# Patient Record
Sex: Male | Born: 1965 | Race: Black or African American | Hispanic: No | Marital: Married | Smoking: Never smoker
Health system: Southern US, Community
[De-identification: ages and names within clinical notes are randomized; demographics above are authoritative.]

## PROBLEM LIST (undated history)

## (undated) DIAGNOSIS — K5090 Crohn's disease, unspecified, without complications: Secondary | ICD-10-CM

## (undated) DIAGNOSIS — M1990 Unspecified osteoarthritis, unspecified site: Secondary | ICD-10-CM

## (undated) DIAGNOSIS — K219 Gastro-esophageal reflux disease without esophagitis: Secondary | ICD-10-CM

## (undated) DIAGNOSIS — I10 Essential (primary) hypertension: Secondary | ICD-10-CM

## (undated) HISTORY — PX: OTHER SURGICAL HISTORY: SHX169

## (undated) HISTORY — PX: APPENDECTOMY: SHX54

## (undated) HISTORY — PX: ABDOMINAL SURGERY: SHX537

## (undated) HISTORY — PX: TESTICLE SURGERY: SHX794

---

## 2004-10-05 ENCOUNTER — Ambulatory Visit: Payer: Self-pay | Admitting: Internal Medicine

## 2004-10-05 ENCOUNTER — Emergency Department: Payer: Self-pay | Admitting: Unknown Physician Specialty

## 2005-06-22 ENCOUNTER — Ambulatory Visit: Payer: Self-pay | Admitting: Internal Medicine

## 2005-06-29 ENCOUNTER — Emergency Department: Payer: Self-pay | Admitting: Emergency Medicine

## 2005-06-29 ENCOUNTER — Other Ambulatory Visit: Payer: Self-pay

## 2005-11-15 ENCOUNTER — Emergency Department: Payer: Self-pay | Admitting: Emergency Medicine

## 2007-01-24 ENCOUNTER — Ambulatory Visit: Payer: Self-pay | Admitting: Internal Medicine

## 2007-02-17 ENCOUNTER — Emergency Department: Payer: Self-pay | Admitting: Internal Medicine

## 2007-05-29 ENCOUNTER — Other Ambulatory Visit: Payer: Self-pay

## 2007-05-29 ENCOUNTER — Emergency Department: Payer: Self-pay | Admitting: Emergency Medicine

## 2007-08-02 ENCOUNTER — Emergency Department: Payer: Self-pay | Admitting: Unknown Physician Specialty

## 2007-08-02 ENCOUNTER — Other Ambulatory Visit: Payer: Self-pay

## 2007-09-01 ENCOUNTER — Emergency Department: Payer: Self-pay | Admitting: Emergency Medicine

## 2008-02-18 ENCOUNTER — Ambulatory Visit: Payer: Self-pay | Admitting: Gastroenterology

## 2008-03-07 ENCOUNTER — Emergency Department: Payer: Self-pay | Admitting: Emergency Medicine

## 2008-03-10 ENCOUNTER — Ambulatory Visit: Payer: Self-pay | Admitting: Gastroenterology

## 2008-07-02 ENCOUNTER — Ambulatory Visit: Payer: Self-pay | Admitting: Gastroenterology

## 2009-01-13 ENCOUNTER — Ambulatory Visit: Payer: Self-pay | Admitting: Internal Medicine

## 2009-07-18 ENCOUNTER — Emergency Department: Payer: Self-pay | Admitting: Unknown Physician Specialty

## 2010-03-16 ENCOUNTER — Ambulatory Visit: Payer: Self-pay | Admitting: Internal Medicine

## 2011-02-24 ENCOUNTER — Ambulatory Visit: Payer: Self-pay | Admitting: Gastroenterology

## 2011-03-03 ENCOUNTER — Ambulatory Visit: Payer: Self-pay | Admitting: Gastroenterology

## 2011-03-07 LAB — PATHOLOGY REPORT

## 2011-04-13 ENCOUNTER — Other Ambulatory Visit: Payer: Self-pay | Admitting: Gastroenterology

## 2012-07-29 ENCOUNTER — Emergency Department: Payer: Self-pay | Admitting: Emergency Medicine

## 2012-07-29 LAB — URINALYSIS, COMPLETE
Bacteria: NONE SEEN
Bilirubin,UR: NEGATIVE
Blood: NEGATIVE
Ketone: NEGATIVE
Nitrite: NEGATIVE
Ph: 5 (ref 4.5–8.0)
RBC,UR: 1 /HPF (ref 0–5)
Specific Gravity: 1.028 (ref 1.003–1.030)
WBC UR: 1 /HPF (ref 0–5)

## 2012-07-29 LAB — COMPREHENSIVE METABOLIC PANEL
Albumin: 3.9 g/dL (ref 3.4–5.0)
Alkaline Phosphatase: 129 U/L (ref 50–136)
Anion Gap: 6 — ABNORMAL LOW (ref 7–16)
BUN: 14 mg/dL (ref 7–18)
Calcium, Total: 9.2 mg/dL (ref 8.5–10.1)
Chloride: 105 mmol/L (ref 98–107)
Co2: 30 mmol/L (ref 21–32)
Creatinine: 1.19 mg/dL (ref 0.60–1.30)
Osmolality: 281 (ref 275–301)
Total Protein: 7.9 g/dL (ref 6.4–8.2)

## 2012-07-29 LAB — CBC
MCH: 26.7 pg (ref 26.0–34.0)
MCHC: 32.8 g/dL (ref 32.0–36.0)
MCV: 82 fL (ref 80–100)
Platelet: 289 10*3/uL (ref 150–440)
RBC: 5.01 10*6/uL (ref 4.40–5.90)
RDW: 15.1 % — ABNORMAL HIGH (ref 11.5–14.5)

## 2012-07-29 LAB — LIPASE, BLOOD: Lipase: 109 U/L (ref 73–393)

## 2012-08-11 ENCOUNTER — Emergency Department: Payer: Self-pay | Admitting: Internal Medicine

## 2012-08-11 LAB — URINALYSIS, COMPLETE
Bacteria: NONE SEEN
Blood: NEGATIVE
Glucose,UR: NEGATIVE mg/dL (ref 0–75)
Ketone: NEGATIVE
Leukocyte Esterase: NEGATIVE
Ph: 6 (ref 4.5–8.0)
RBC,UR: 3 /HPF (ref 0–5)
Specific Gravity: 1.02 (ref 1.003–1.030)
WBC UR: 2 /HPF (ref 0–5)

## 2012-08-11 LAB — COMPREHENSIVE METABOLIC PANEL
Alkaline Phosphatase: 108 U/L (ref 50–136)
Anion Gap: 7 (ref 7–16)
BUN: 17 mg/dL (ref 7–18)
Bilirubin,Total: 0.4 mg/dL (ref 0.2–1.0)
Chloride: 104 mmol/L (ref 98–107)
Co2: 31 mmol/L (ref 21–32)
Osmolality: 286 (ref 275–301)
Potassium: 3.1 mmol/L — ABNORMAL LOW (ref 3.5–5.1)
Sodium: 142 mmol/L (ref 136–145)
Total Protein: 7.1 g/dL (ref 6.4–8.2)

## 2012-08-11 LAB — CBC
HCT: 38 % — ABNORMAL LOW (ref 40.0–52.0)
HGB: 12.9 g/dL — ABNORMAL LOW (ref 13.0–18.0)
MCH: 27.4 pg (ref 26.0–34.0)
MCV: 81 fL (ref 80–100)
Platelet: 304 10*3/uL (ref 150–440)
RBC: 4.7 10*6/uL (ref 4.40–5.90)
RDW: 15 % — ABNORMAL HIGH (ref 11.5–14.5)

## 2012-08-11 LAB — LIPASE, BLOOD: Lipase: 101 U/L (ref 73–393)

## 2012-08-15 ENCOUNTER — Ambulatory Visit: Payer: Self-pay | Admitting: Gastroenterology

## 2012-08-16 LAB — PATHOLOGY REPORT

## 2012-12-13 ENCOUNTER — Other Ambulatory Visit: Payer: Self-pay | Admitting: Gastroenterology

## 2012-12-13 LAB — CLOSTRIDIUM DIFFICILE BY PCR

## 2012-12-15 LAB — STOOL CULTURE

## 2013-03-08 ENCOUNTER — Emergency Department: Payer: Self-pay | Admitting: Emergency Medicine

## 2013-03-16 ENCOUNTER — Emergency Department: Payer: Self-pay | Admitting: Emergency Medicine

## 2013-03-16 LAB — CBC
HGB: 12.8 g/dL — ABNORMAL LOW (ref 13.0–18.0)
MCHC: 34.9 g/dL (ref 32.0–36.0)
MCV: 80 fL (ref 80–100)
Platelet: 264 10*3/uL (ref 150–440)
WBC: 9.1 10*3/uL (ref 3.8–10.6)

## 2013-03-16 LAB — COMPREHENSIVE METABOLIC PANEL
BUN: 11 mg/dL (ref 7–18)
Bilirubin,Total: 0.4 mg/dL (ref 0.2–1.0)
Calcium, Total: 8.9 mg/dL (ref 8.5–10.1)
Chloride: 106 mmol/L (ref 98–107)
Creatinine: 1.19 mg/dL (ref 0.60–1.30)
EGFR (African American): >60
EGFR (Non-African Amer.): >60
Osmolality: 279 (ref 275–301)
SGPT (ALT): 21 U/L (ref 12–78)
Sodium: 140 mmol/L (ref 136–145)

## 2013-03-16 LAB — LIPASE, BLOOD: Lipase: 66 U/L — ABNORMAL LOW (ref 73–393)

## 2013-03-16 LAB — URINALYSIS, COMPLETE
Ketone: NEGATIVE
RBC,UR: 1 /HPF (ref 0–5)
WBC UR: 2 /HPF (ref 0–5)

## 2013-08-25 ENCOUNTER — Emergency Department: Payer: Self-pay | Admitting: Emergency Medicine

## 2013-08-25 LAB — COMPREHENSIVE METABOLIC PANEL
ALT: 25 U/L (ref 12–78)
Albumin: 3.5 g/dL (ref 3.4–5.0)
Alkaline Phosphatase: 99 U/L
Anion Gap: 3 — ABNORMAL LOW (ref 7–16)
BUN: 14 mg/dL (ref 7–18)
Bilirubin,Total: 0.7 mg/dL (ref 0.2–1.0)
Calcium, Total: 9.2 mg/dL (ref 8.5–10.1)
Chloride: 106 mmol/L (ref 98–107)
Co2: 32 mmol/L (ref 21–32)
Creatinine: 1.18 mg/dL (ref 0.60–1.30)
EGFR (African American): >60
EGFR (Non-African Amer.): >60
Glucose: 91 mg/dL (ref 65–99)
OSMOLALITY: 281 (ref 275–301)
Potassium: 3.1 mmol/L — ABNORMAL LOW (ref 3.5–5.1)
SGOT(AST): 19 U/L (ref 15–37)
Sodium: 141 mmol/L (ref 136–145)
Total Protein: 7 g/dL (ref 6.4–8.2)

## 2013-08-25 LAB — CBC WITH DIFFERENTIAL/PLATELET
BASOS ABS: 0.1 10*3/uL (ref 0.0–0.1)
Basophil %: 0.8 %
Eosinophil #: 0.2 10*3/uL (ref 0.0–0.7)
Eosinophil %: 2.2 %
HCT: 38.3 % — ABNORMAL LOW (ref 40.0–52.0)
HGB: 12.8 g/dL — AB (ref 13.0–18.0)
LYMPHS ABS: 1.5 10*3/uL (ref 1.0–3.6)
Lymphocyte %: 15.9 %
MCH: 27 pg (ref 26.0–34.0)
MCHC: 33.4 g/dL (ref 32.0–36.0)
MCV: 81 fL (ref 80–100)
MONOS PCT: 8.3 %
Monocyte #: 0.8 x10 3/mm (ref 0.2–1.0)
NEUTROS ABS: 6.8 10*3/uL — AB (ref 1.4–6.5)
NEUTROS PCT: 72.8 %
PLATELETS: 315 10*3/uL (ref 150–440)
RBC: 4.75 10*6/uL (ref 4.40–5.90)
RDW: 14.9 % — ABNORMAL HIGH (ref 11.5–14.5)
WBC: 9.3 10*3/uL (ref 3.8–10.6)

## 2013-08-25 LAB — LIPASE, BLOOD: LIPASE: 98 U/L (ref 73–393)

## 2013-10-15 ENCOUNTER — Emergency Department: Payer: Self-pay | Admitting: Emergency Medicine

## 2013-10-15 LAB — CBC WITH DIFFERENTIAL/PLATELET
Basophil #: 0.1 10*3/uL (ref 0.0–0.1)
Basophil %: 0.9 %
Eosinophil #: 0.3 10*3/uL (ref 0.0–0.7)
Eosinophil %: 3.4 %
HCT: 39.5 % — ABNORMAL LOW (ref 40.0–52.0)
HGB: 13.2 g/dL (ref 13.0–18.0)
Lymphocyte #: 0.8 10*3/uL — ABNORMAL LOW (ref 1.0–3.6)
Lymphocyte %: 11.1 %
MCH: 27.1 pg (ref 26.0–34.0)
MCHC: 33.5 g/dL (ref 32.0–36.0)
MCV: 81 fL (ref 80–100)
Monocyte #: 0.4 x10 3/mm (ref 0.2–1.0)
Monocyte %: 4.9 %
NEUTROS PCT: 79.7 %
Neutrophil #: 6 10*3/uL (ref 1.4–6.5)
Platelet: 248 10*3/uL (ref 150–440)
RBC: 4.88 10*6/uL (ref 4.40–5.90)
RDW: 14.8 % — ABNORMAL HIGH (ref 11.5–14.5)
WBC: 7.6 10*3/uL (ref 3.8–10.6)

## 2013-10-15 LAB — COMPREHENSIVE METABOLIC PANEL
ALBUMIN: 3.6 g/dL (ref 3.4–5.0)
ALK PHOS: 109 U/L
ANION GAP: 5 — AB (ref 7–16)
BUN: 14 mg/dL (ref 7–18)
Bilirubin,Total: 0.6 mg/dL (ref 0.2–1.0)
CREATININE: 1.17 mg/dL (ref 0.60–1.30)
Calcium, Total: 9 mg/dL (ref 8.5–10.1)
Chloride: 105 mmol/L (ref 98–107)
Co2: 32 mmol/L (ref 21–32)
EGFR (African American): >60
EGFR (Non-African Amer.): >60
Glucose: 118 mg/dL — ABNORMAL HIGH (ref 65–99)
OSMOLALITY: 285 (ref 275–301)
Potassium: 3 mmol/L — ABNORMAL LOW (ref 3.5–5.1)
SGOT(AST): 14 U/L — ABNORMAL LOW (ref 15–37)
SGPT (ALT): 21 U/L (ref 12–78)
SODIUM: 142 mmol/L (ref 136–145)
Total Protein: 7.1 g/dL (ref 6.4–8.2)

## 2013-10-15 LAB — URINALYSIS, COMPLETE
BACTERIA: NONE SEEN
BLOOD: NEGATIVE
Bilirubin,UR: NEGATIVE
Glucose,UR: NEGATIVE mg/dL (ref 0–75)
KETONE: NEGATIVE
Leukocyte Esterase: NEGATIVE
Nitrite: NEGATIVE
Ph: 5 (ref 4.5–8.0)
Protein: NEGATIVE
RBC,UR: <1 /HPF (ref 0–5)
Specific Gravity: 1.02 (ref 1.003–1.030)

## 2013-10-15 LAB — LIPASE, BLOOD: Lipase: 99 U/L (ref 73–393)

## 2014-01-10 ENCOUNTER — Emergency Department: Payer: Self-pay | Admitting: Emergency Medicine

## 2014-07-04 ENCOUNTER — Emergency Department: Payer: Self-pay | Admitting: Emergency Medicine

## 2014-07-07 ENCOUNTER — Emergency Department: Payer: Self-pay | Admitting: Student

## 2014-07-17 ENCOUNTER — Emergency Department: Payer: Self-pay | Admitting: Emergency Medicine

## 2014-11-13 ENCOUNTER — Emergency Department
Admission: EM | Admit: 2014-11-13 | Discharge: 2014-11-13 | Disposition: A | Discharge status: Home / Self Care | Payer: 59 | Attending: Student | Admitting: Student

## 2014-11-13 DIAGNOSIS — M549 Dorsalgia, unspecified: Secondary | ICD-10-CM | POA: Diagnosis present

## 2014-11-13 DIAGNOSIS — M5416 Radiculopathy, lumbar region: Secondary | ICD-10-CM | POA: Insufficient documentation

## 2014-11-13 DIAGNOSIS — M62830 Muscle spasm of back: Secondary | ICD-10-CM

## 2014-11-13 MED ORDER — TRAMADOL HCL 50 MG PO TABS: 50.0000 mg | ORAL_TABLET | Freq: Four times a day (QID) | ORAL | Status: DC | PRN

## 2014-11-13 MED ORDER — PREDNISONE 10 MG PO TABS: 10.0000 mg | ORAL_TABLET | Freq: Every day | ORAL | Status: DC

## 2014-11-13 MED ORDER — CYCLOBENZAPRINE HCL 5 MG PO TABS: 5.0000 mg | ORAL_TABLET | Freq: Three times a day (TID) | ORAL | Status: DC | PRN

## 2014-11-13 NOTE — ED Notes (Signed)
Pt states "i've been dealing with this for a long time, more than a couple of weeks." pt complains of low and mid back pain that radiates down both posterior legs, more right than left per pt. Pt denies tingling to arms, legs.

## 2014-11-13 NOTE — ED Provider Notes (Signed)
CSN: [id number]     Arrival date & time 11/13/14  1942 History   First MD Initiated Contact with Patient 11/13/14 2134     Chief Complaint  Patient presents with  . Back Pain     (Consider location/radiation/quality/duration/timing/severity/associated sxs/prior Treatment) Patient is a 49 y.o. male presenting with back pain.  Back Pain Associated symptoms: no abdominal pain, no chest pain, no dysuria, no fever and no headaches    49 year old male with 3-4 week history of low back pain described as tightness and spasms with shooting pain going down the right posterior thigh and calf. Patient typically takes Flexeril but has been out of his muscle relaxers for the last few weeks. His pain has been moderate, 8 out of 10. He denies any weakness or loss of bowel or bladder symptoms. No trauma or injury. Pain is increased with standing walking and twisting. He gets relief with lying. He denies any urinary symptoms.   No past medical history on file. No past surgical history on file. No family history on file. History  Substance Use Topics  . Smoking status: Not on file  . Smokeless tobacco: Not on file  . Alcohol Use: Not on file    Review of Systems  Constitutional: Negative.  Negative for fever, chills, activity change and appetite change.  HENT: Negative for congestion, ear pain, mouth sores, rhinorrhea, sinus pressure, sore throat and trouble swallowing.   Eyes: Negative for photophobia, pain and discharge.  Respiratory: Negative for cough, chest tightness and shortness of breath.   Cardiovascular: Negative for chest pain and leg swelling.  Gastrointestinal: Negative for nausea, vomiting, abdominal pain, diarrhea and abdominal distention.  Genitourinary: Negative for dysuria and difficulty urinating.  Musculoskeletal: Positive for back pain. Negative for arthralgias and gait problem.  Skin: Negative for color change and rash.  Neurological: Negative for dizziness and headaches.   Hematological: Negative for adenopathy.  Psychiatric/Behavioral: Negative for behavioral problems and agitation.      Allergies  Ivp dye  Home Medications   Prior to Admission medications   Medication Sig Start Date End Date Taking? Authorizing Provider  cyclobenzaprine (FLEXERIL) 5 MG tablet Take 1 tablet (5 mg total) by mouth every 8 (eight) hours as needed for muscle spasms. 11/13/14   Duanne Guess, PA-C  predniSONE (DELTASONE) 10 MG tablet Take 1 tablet (10 mg total) by mouth daily. 6,5,4,3,2,1 six day taper 11/13/14   Duanne Guess, PA-C  traMADol (ULTRAM) 50 MG tablet Take 1 tablet (50 mg total) by mouth every 6 (six) hours as needed. 11/13/14   Duanne Guess, PA-C   BP 127/81 mmHg  Pulse 84  Temp(Src) 98.4 F (36.9 C) (Oral)  Resp 14  Ht 5' 5"  (1.651 m)  Wt 222 lb (100.699 kg)  BMI 36.94 kg/m2  SpO2 98% Physical Exam  Constitutional: He is oriented to person, place, and time. He appears well-developed and well-nourished.  HENT:  Head: Normocephalic and atraumatic.  Eyes: Conjunctivae and EOM are normal. Pupils are equal, round, and reactive to light.  Neck: Normal range of motion. Neck supple.  Cardiovascular: Normal rate, regular rhythm, normal heart sounds and intact distal pulses.   Pulmonary/Chest: Effort normal and breath sounds normal. No respiratory distress. He has no wheezes. He has no rales. He exhibits no tenderness.  Abdominal: Soft. Bowel sounds are normal. He exhibits no distension. There is no tenderness.  Musculoskeletal:       Lumbar back: He exhibits spasm (paravertebral muscles lumbar spine and  lumbosacral junction). He exhibits normal range of motion, no tenderness, no bony tenderness, no swelling and no edema.  Patient with negative straight leg raise bilaterally. Full range of motion of the hips knees and ankles. Neurovascular intact in bilateral lower extremities  Neurological: He is alert and oriented to person, place, and time.  Skin: Skin is  warm and dry.  Psychiatric: He has a normal mood and affect. His behavior is normal. Judgment and thought content normal.    ED Course  Procedures (including critical care time) Labs Review Labs Reviewed - No data to display  Imaging Review No results found.   EKG Interpretation None      MDM   Final diagnoses:  Lumbar back pain with radiculopathy affecting right lower extremity  Lumbar paraspinal muscle spasm    49 year old male with 3-4 week history of tightness in the lower back with worsening pain shooting down the posterior aspect of his right thigh and calf. Patient was given a prescription for 6 day prednisone taper, Flexeril, tramadol. He will follow-up with orthopedics if no relief in one week. Return to the ER for worsening symptoms or urgent changes in his health    Duanne Guess, PA-C 11/13/14 2149  Joanne Gavel, MD 11/14/14 [phone]

## 2014-11-13 NOTE — Discharge Instructions (Signed)
Back Exercises Back exercises help treat and prevent back injuries. The goal of back exercises is to increase the strength of your abdominal and back muscles and the flexibility of your back. These exercises should be started when you no longer have back pain. Back exercises include:  Pelvic Tilt. Lie on your back with your knees bent. Tilt your pelvis until the lower part of your back is against the floor. Hold this position 5 to 10 sec and repeat 5 to 10 times.  Knee to Chest. Pull first 1 knee up against your chest and hold for 20 to 30 seconds, repeat this with the other knee, and then both knees. This may be done with the other leg straight or bent, whichever feels better.  Sit-Ups or Curl-Ups. Bend your knees 90 degrees. Start with tilting your pelvis, and do a partial, slow sit-up, lifting your trunk only 30 to 45 degrees off the floor. Take at least 2 to 3 seconds for each sit-up. Do not do sit-ups with your knees out straight. If partial sit-ups are difficult, simply do the above but with only tightening your abdominal muscles and holding it as directed.  Hip-Lift. Lie on your back with your knees flexed 90 degrees. Push down with your feet and shoulders as you raise your hips a couple inches off the floor; hold for 10 seconds, repeat 5 to 10 times.  Back arches. Lie on your stomach, propping yourself up on bent elbows. Slowly press on your hands, causing an arch in your low back. Repeat 3 to 5 times. Any initial stiffness and discomfort should lessen with repetition over time.  Shoulder-Lifts. Lie face down with arms beside your body. Keep hips and torso pressed to floor as you slowly lift your head and shoulders off the floor. Do not overdo your exercises, especially in the beginning. Exercises may cause you some mild back discomfort which lasts for a few minutes; however, if the pain is more severe, or lasts for more than 15 minutes, do not continue exercises until you see your caregiver.  Improvement with exercise therapy for back problems is slow.  See your caregivers for assistance with developing a proper back exercise program. Document Released: 06/08/2004 Document Revised: 07/24/2011 Document Reviewed: 03/02/2011 [facility] Patient Information 2015 [place], [place]. This information is not intended to replace advice given to you by your health care provider. Make sure you discuss any questions you have with your health care provider.  Back Pain, Adult Back pain is very common. The pain often gets better over time. The cause of back pain is usually not dangerous. Most people can learn to manage their back pain on their own.  HOME CARE   Stay active. Start with short walks on flat ground if you can. Try to walk farther each day.  Do not sit, drive, or stand in one place for more than 30 minutes. Do not stay in bed.  Do not avoid exercise or work. Activity can help your back heal faster.  Be careful when you bend or lift an object. Bend at your knees, keep the object close to you, and do not twist.  Sleep on a firm mattress. Lie on your side, and bend your knees. If you lie on your back, put a pillow under your knees.  Only take medicines as told by your doctor.  Put ice on the injured area.  Put ice in a plastic bag.  Place a towel between your skin and the bag.  Leave the  ice on for 15-20 minutes, 03-04 times a day for the first 2 to 3 days. After that, you can switch between ice and heat packs.  Ask your doctor about back exercises or massage.  Avoid feeling anxious or stressed. Find good ways to deal with stress, such as exercise. GET HELP RIGHT AWAY IF:   Your pain does not go away with rest or medicine.  Your pain does not go away in 1 week.  You have new problems.  You do not feel well.  The pain spreads into your legs.  You cannot control when you poop (bowel movement) or pee (urinate).  Your arms or legs feel weak or lose feeling  (numbness).  You feel sick to your stomach (nauseous) or throw up (vomit).  You have belly (abdominal) pain.  You feel like you may pass out (faint). MAKE SURE YOU:   Understand these instructions.  Will watch your condition.  Will get help right away if you are not doing well or get worse. Document Released: 10/18/2007 Document Revised: 07/24/2011 Document Reviewed: 09/02/2013 [facility] Patient Information 2015 [place], [place]. This information is not intended to replace advice given to you by your health care provider. Make sure you discuss any questions you have with your health care provider.

## 2014-11-13 NOTE — ED Notes (Signed)
Pt reports pain to lower to mid back x over two weeks.  Pt reports radiation down both legs.  Pt reports it has happened before and pt had sciatica.  Pt reports accident many years ago that has given him chronic problems with his back.  Pt denies numbness/tingling.  Pt NAD at this time.

## 2014-12-10 ENCOUNTER — Emergency Department: Payer: 59

## 2014-12-10 ENCOUNTER — Encounter: Payer: Self-pay | Admitting: Emergency Medicine

## 2014-12-10 ENCOUNTER — Emergency Department
Admission: EM | Admit: 2014-12-10 | Discharge: 2014-12-10 | Disposition: A | Discharge status: Home / Self Care | Payer: 59 | Attending: Emergency Medicine | Admitting: Emergency Medicine

## 2014-12-10 DIAGNOSIS — M549 Dorsalgia, unspecified: Secondary | ICD-10-CM | POA: Insufficient documentation

## 2014-12-10 DIAGNOSIS — Z79899 Other long term (current) drug therapy: Secondary | ICD-10-CM | POA: Insufficient documentation

## 2014-12-10 DIAGNOSIS — I10 Essential (primary) hypertension: Secondary | ICD-10-CM | POA: Diagnosis not present

## 2014-12-10 DIAGNOSIS — G8929 Other chronic pain: Secondary | ICD-10-CM | POA: Insufficient documentation

## 2014-12-10 DIAGNOSIS — E876 Hypokalemia: Secondary | ICD-10-CM | POA: Insufficient documentation

## 2014-12-10 HISTORY — DX: Essential (primary) hypertension: I10

## 2014-12-10 LAB — URINALYSIS COMPLETE WITH MICROSCOPIC (ARMC ONLY)
BILIRUBIN URINE: NEGATIVE
Bacteria, UA: NONE SEEN
Glucose, UA: NEGATIVE mg/dL
HGB URINE DIPSTICK: NEGATIVE
KETONES UR: NEGATIVE mg/dL
LEUKOCYTES UA: NEGATIVE
Nitrite: NEGATIVE
Protein, ur: NEGATIVE mg/dL
Specific Gravity, Urine: 1.021 (ref 1.005–1.030)
pH: 5 (ref 5.0–8.0)

## 2014-12-10 LAB — CBC WITH DIFFERENTIAL/PLATELET
Basophils Absolute: 0.1 10*3/uL (ref 0–0.1)
Basophils Relative: 1 %
EOS PCT: 5 %
Eosinophils Absolute: 0.3 10*3/uL (ref 0–0.7)
HCT: 39.9 % — ABNORMAL LOW (ref 40.0–52.0)
Hemoglobin: 13.1 g/dL (ref 13.0–18.0)
LYMPHS ABS: 1.3 10*3/uL (ref 1.0–3.6)
Lymphocytes Relative: 22 %
MCH: 26.5 pg (ref 26.0–34.0)
MCHC: 32.8 g/dL (ref 32.0–36.0)
MCV: 80.9 fL (ref 80.0–100.0)
MONO ABS: 0.6 10*3/uL (ref 0.2–1.0)
MONOS PCT: 10 %
Neutro Abs: 3.9 10*3/uL (ref 1.4–6.5)
Neutrophils Relative %: 62 %
Platelets: 295 10*3/uL (ref 150–440)
RBC: 4.94 MIL/uL (ref 4.40–5.90)
RDW: 15 % — ABNORMAL HIGH (ref 11.5–14.5)
WBC: 6.2 10*3/uL (ref 3.8–10.6)

## 2014-12-10 LAB — COMPREHENSIVE METABOLIC PANEL
ALBUMIN: 4.1 g/dL (ref 3.5–5.0)
ALT: 20 U/L (ref 17–63)
ANION GAP: 8 (ref 5–15)
AST: 19 U/L (ref 15–41)
Alkaline Phosphatase: 91 U/L (ref 38–126)
BILIRUBIN TOTAL: 1 mg/dL (ref 0.3–1.2)
BUN: 18 mg/dL (ref 6–20)
CALCIUM: 9.4 mg/dL (ref 8.9–10.3)
CO2: 27 mmol/L (ref 22–32)
CREATININE: 1.17 mg/dL (ref 0.61–1.24)
Chloride: 104 mmol/L (ref 101–111)
GFR calc non Af Amer: >60 mL/min (ref >60)
Glucose, Bld: 107 mg/dL — ABNORMAL HIGH (ref 65–99)
Potassium: 3.3 mmol/L — ABNORMAL LOW (ref 3.5–5.1)
Sodium: 139 mmol/L (ref 135–145)
Total Protein: 7.8 g/dL (ref 6.5–8.1)

## 2014-12-10 MED ORDER — ETODOLAC 400 MG PO TABS: 400.0000 mg | ORAL_TABLET | Freq: Two times a day (BID) | ORAL | Status: DC

## 2014-12-10 MED ORDER — POTASSIUM CHLORIDE CRYS ER 10 MEQ PO TBCR: EXTENDED_RELEASE_TABLET | ORAL | Status: DC

## 2014-12-10 MED ORDER — POTASSIUM CHLORIDE CRYS ER 20 MEQ PO TBCR
10.0000 meq | EXTENDED_RELEASE_TABLET | Freq: Once | ORAL | Status: AC
  Administered 2014-12-10: 10 meq via ORAL
  Filled 2014-12-10: qty 1

## 2014-12-10 MED ORDER — KETOROLAC TROMETHAMINE 60 MG/2ML IM SOLN
60.0000 mg | Freq: Once | INTRAMUSCULAR | Status: AC
  Administered 2014-12-10: 60 mg via INTRAMUSCULAR
  Filled 2014-12-10: qty 2

## 2014-12-10 NOTE — ED Notes (Signed)
Patient transported to CT 

## 2014-12-10 NOTE — Discharge Instructions (Signed)
CALL TO MAKE AN APPOINTMENT WITH DR. Roland Rack AT [facility] FOR INFLAMMATION AND PAIN ICE OR HEAT TO BACK AS NEEDED

## 2014-12-10 NOTE — ED Notes (Signed)
Pt states he has chronic back pain, but muscle spasms and pain have increased recently.

## 2014-12-10 NOTE — ED Provider Notes (Signed)
[facility] Emergency Department Provider Note  ____________________________________________  Time seen:  8:16 AM  I have reviewed the triage vital signs and the nursing notes.   HISTORY  Chief Complaint Back Pain   HPI Spencer Heath is a 49 y.o. male is here today with complaint of chronic back pain. He states that he is having spasms and increased pain. He has been seen the first of this month in the emergency room for the same. He states that he was not given anyone to follow-up with. He denies any injury and states that he was taking Flexeril as muscle relaxant and has now ran out.He does get some relief when lying down however pain is increased with standing and walking. He denies any bowel or bladder incontinence. He does not have a regular doctor that he sees for his back. Currently he states his pain is 10 out of 10.  Prior to discharge she also told the nurse that he had "tingling in his face" for 48 hours. He denies any difficulty swallowing or speaking or breathing. He denied any visual changes. He denies any upper respiratory symptoms or throat pain. There is been no fever or chills. He denies any headaches. He denies any previous stroke symptoms.   Past Medical History  Diagnosis Date  . Hypertension     There are no active problems to display for this patient.   Past Surgical History  Procedure Laterality Date  . Abdominal surgery    . Testicle surgery      Current Outpatient Rx  Name  Route  Sig  Dispense  Refill  . cyclobenzaprine (FLEXERIL) 5 MG tablet   Oral   Take 1 tablet (5 mg total) by mouth every 8 (eight) hours as needed for muscle spasms.   30 tablet   1   . etodolac (LODINE) 400 MG tablet   Oral   Take 1 tablet (400 mg total) by mouth 2 (two) times daily.   20 tablet   0   . potassium chloride (K-DUR,KLOR-CON) 10 MEQ tablet      Take one tablet per day x 5 days starting Friday   5 tablet   0   . predniSONE  (DELTASONE) 10 MG tablet   Oral   Take 1 tablet (10 mg total) by mouth daily. 6,5,4,3,2,1 six day taper   21 tablet   0   . traMADol (ULTRAM) 50 MG tablet   Oral   Take 1 tablet (50 mg total) by mouth every 6 (six) hours as needed.   20 tablet   0     Allergies Ivp dye  No family history on file.  Social History History  Substance Use Topics  . Smoking status: Never Smoker   . Smokeless tobacco: Not on file  . Alcohol Use: No    Review of Systems Constitutional: No fever/chills Eyes: No visual changes. ENT: No sore throat. Cardiovascular: Denies chest pain. Respiratory: Denies shortness of breath. Gastrointestinal: No abdominal pain.  No nausea, no vomiting. . Genitourinary: Negative for dysuria. Musculoskeletal: Positive for back pain. Skin: Negative for rash. Neurological: Negative for headaches, focal weakness. Positive for bilateral facial numbness.  10-point ROS otherwise negative.  ____________________________________________   PHYSICAL EXAM:  VITAL SIGNS: ED Triage Vitals  Enc Vitals Group     BP 12/10/14 0808 118/74 mmHg     Pulse Rate 12/10/14 0808 83     Resp --      Temp 12/10/14 0808 98.1 F (  36.7 C)     Temp Source 12/10/14 0808 Oral     SpO2 12/10/14 0808 98 %     Weight 12/10/14 0808 222 lb (100.699 kg)     Height 12/10/14 0808 5' 5"  (1.651 m)     Head Cir --      Peak Flow --      Pain Score --      Pain Loc --      Pain Edu? --      Excl. in [place]? --     Constitutional: Alert and oriented. Well appearing and in no acute distress. Eyes: Conjunctivae are normal. PERRL. EOMI. Head: Atraumatic. Nose: No congestion/rhinnorhea. Mouth/Throat: Mucous membranes are moist.  Oropharynx non-erythematous. Neck: No stridor.  No cervical tenderness on palpation Hematological/Lymphatic/Immunilogical: No cervical lymphadenopathy. Cardiovascular: Normal rate, regular rhythm. Grossly normal heart sounds.  Good peripheral circulation. Respiratory:  Normal respiratory effort.  No retractions. Lungs CTAB. Gastrointestinal: Soft and nontender. No distention. No abdominal bruits. No CVA tenderness. Musculoskeletal: Back exam no gross deformity. There is tenderness to light palpation diffusely over the entire back. No active muscle spasms were seen. Patient had guarded gait and range of motion. No lower extremity tenderness nor edema.  No joint effusions. Neurologic:  Normal speech and language. No gross focal neurologic deficits are appreciated. No gait instability. Cranial nerves II through XII grossly intact. Reflexes 1+ bilaterally. Grip strength equal bilaterally Skin:  Skin is warm, dry and intact. No rash noted. Psychiatric: Mood and affect are normal. Speech and behavior are normal.  ____________________________________________   LABS (all labs ordered are listed, but only abnormal results are displayed)  Labs Reviewed  CBC WITH DIFFERENTIAL/PLATELET - Abnormal; Notable for the following:    HCT 39.9 (*)    RDW 15.0 (*)    All other components within normal limits  COMPREHENSIVE METABOLIC PANEL - Abnormal; Notable for the following:    Potassium 3.3 (*)    Glucose, Bld 107 (*)    All other components within normal limits  URINALYSIS COMPLETEWITH MICROSCOPIC ([facility] ONLY) - Abnormal; Notable for the following:    Color, Urine YELLOW (*)    APPearance CLEAR (*)    Squamous Epithelial / LPF 0-5 (*)    All other components within normal limits   RADIOLOGY  CT of the head per radiologist shows slight small vessel disease but no intercranial mass or hemorrhage noted no acute infarct ____________________________________________   PROCEDURES  Procedure(s) performed: None  Critical Care performed: No  ____________________________________________   INITIAL IMPRESSION / ASSESSMENT AND PLAN / ED COURSE  Pertinent labs & imaging results that were available during my care of the patient were reviewed by me and considered in my  medical decision making (see chart for details).  Patient was given potassium while in the emergency room for low potassium of 3.3. He is also reassured that his CT scan did not show signs of a stroke and neurologically he was intact. Patient is to follow-up with Dr. Roland Rack about his chronic back pain. Patient was given a prescription for 5 days of K-Dur were 10 mEq. His also given a prescription for etodolac for his chronic back pain. He is return to the emergency room if any severe worsening or urgent concerns. He will also make an appointment with his PCP to have his potassium rechecked. ____________________________________________   FINAL CLINICAL IMPRESSION(S) / ED DIAGNOSES  Final diagnoses:  Chronic back pain greater than 3 months duration  Acute hypokalemia  Johnn Hai, PA-C 12/10/14 [place], MD 12/10/14 [phone]

## 2014-12-10 NOTE — ED Notes (Signed)
Pt states he began feeling facial tingling last night, and this am, his entire face feels "numb." Neuro check remarkable. Rhonda, Spencer Heath notified, orders obtained, will continue to monitor.

## 2014-12-26 ENCOUNTER — Encounter: Payer: Self-pay | Admitting: Emergency Medicine

## 2014-12-26 ENCOUNTER — Emergency Department
Admission: EM | Admit: 2014-12-26 | Discharge: 2014-12-26 | Disposition: A | Discharge status: Home / Self Care | Payer: 59 | Attending: Emergency Medicine | Admitting: Emergency Medicine

## 2014-12-26 DIAGNOSIS — M546 Pain in thoracic spine: Secondary | ICD-10-CM | POA: Insufficient documentation

## 2014-12-26 DIAGNOSIS — Z79899 Other long term (current) drug therapy: Secondary | ICD-10-CM | POA: Diagnosis not present

## 2014-12-26 DIAGNOSIS — I10 Essential (primary) hypertension: Secondary | ICD-10-CM | POA: Diagnosis not present

## 2014-12-26 DIAGNOSIS — M545 Low back pain, unspecified: Secondary | ICD-10-CM

## 2014-12-26 DIAGNOSIS — Z791 Long term (current) use of non-steroidal anti-inflammatories (NSAID): Secondary | ICD-10-CM | POA: Diagnosis not present

## 2014-12-26 HISTORY — DX: Crohn's disease, unspecified, without complications: K50.90

## 2014-12-26 MED ORDER — METHOCARBAMOL 500 MG PO TABS: 500.0000 mg | ORAL_TABLET | Freq: Four times a day (QID) | ORAL | Status: DC | PRN

## 2014-12-26 MED ORDER — KETOROLAC TROMETHAMINE 60 MG/2ML IM SOLN
60.0000 mg | Freq: Once | INTRAMUSCULAR | Status: AC
  Administered 2014-12-26: 60 mg via INTRAMUSCULAR
  Filled 2014-12-26: qty 2

## 2014-12-26 MED ORDER — CAPSAICIN 0.1 % EX CREA: TOPICAL_CREAM | CUTANEOUS | Status: DC

## 2014-12-26 NOTE — ED Provider Notes (Signed)
[facility] Emergency Department Provider Note  ____________________________________________  Time seen: Approximately 2:57 PM  I have reviewed the triage vital signs and the nursing notes.   HISTORY  Chief Complaint No chief complaint on file.    HPI Spencer Heath is a 49 y.o. male presents for evaluation of mid-upper back pain. Patient states he is currently going through physical therapy S2 hurts for the same. Is being followed by orthopedics and medicine. States that the therapy is making his back hurt worse and would like cream to help with that.   Past Medical History  Diagnosis Date  . Hypertension   . Crohn's disease     There are no active problems to display for this patient.   Past Surgical History  Procedure Laterality Date  . Abdominal surgery    . Testicle surgery      Current Outpatient Rx  Name  Route  Sig  Dispense  Refill  . Capsaicin 0.1 % CREA      Apply to needed area twice daily.   1 Tube   3   . etodolac (LODINE) 400 MG tablet   Oral   Take 1 tablet (400 mg total) by mouth 2 (two) times daily.   20 tablet   0   . methocarbamol (ROBAXIN) 500 MG tablet   Oral   Take 1 tablet (500 mg total) by mouth every 6 (six) hours as needed for muscle spasms.   30 tablet   0   . potassium chloride (K-DUR,KLOR-CON) 10 MEQ tablet      Take one tablet per day x 5 days starting Friday   5 tablet   0   . predniSONE (DELTASONE) 10 MG tablet   Oral   Take 1 tablet (10 mg total) by mouth daily. 6,5,4,3,2,1 six day taper   21 tablet   0   . traMADol (ULTRAM) 50 MG tablet   Oral   Take 1 tablet (50 mg total) by mouth every 6 (six) hours as needed.   20 tablet   0     Allergies Ivp dye  No family history on file.  Social History Social History  Substance Use Topics  . Smoking status: Never Smoker   . Smokeless tobacco: Never Used  . Alcohol Use: No    Review of Systems Constitutional: No  fever/chills Eyes: No visual changes. ENT: No sore throat. Cardiovascular: Denies chest pain. Respiratory: Denies shortness of breath. Gastrointestinal: No abdominal pain.  No nausea, no vomiting.  No diarrhea.  No constipation. Genitourinary: Negative for dysuria. Musculoskeletal: Positive for upper mid back pain. Skin: Negative for rash. Neurological: Negative for headaches, focal weakness or numbness.  10-point ROS otherwise negative.  ____________________________________________   PHYSICAL EXAM:  VITAL SIGNS: ED Triage Vitals  Enc Vitals Group     BP 12/26/14 1434 138/81 mmHg     Pulse Rate 12/26/14 1434 74     Resp 12/26/14 1434 18     Temp 12/26/14 1434 98.9 F (37.2 C)     Temp Source 12/26/14 1434 Oral     SpO2 12/26/14 1434 96 %     Weight 12/26/14 1434 219 lb (99.338 kg)     Height 12/26/14 1434 5' 5"  (1.651 m)     Head Cir --      Peak Flow --      Pain Score 12/26/14 1436 8     Pain Loc --      Pain Edu? --  Excl. in [place]? --     Constitutional: Alert and oriented. Well appearing and in no acute distress. He ambulates without difficulty. Neck: No stridor.  Negative cervical tenderness.inal bruits. No CVA tenderness. Musculoskeletal: No lower extremity tenderness nor edema.  No joint effusions. Neurologic:  Normal speech and language. No gross focal neurologic deficits are appreciated. No gait instability. Skin:  Skin is warm, dry and intact. No rash noted. Psychiatric: Mood and affect are normal. Speech and behavior are normal.  ____________________________________________   LABS (all labs ordered are listed, but only abnormal results are displayed)  Labs Reviewed - No data to display   PROCEDURES  Procedure(s) performed: None  Critical Care performed: No  ____________________________________________   INITIAL IMPRESSION / ASSESSMENT AND PLAN / ED COURSE  Pertinent labs & imaging results that were available during my care of the patient  were reviewed by me and considered in my medical decision making (see chart for details).  Recurrent/chronic back pain and currently undergoing physical therapy. Rx given for [place] cream and Robaxin 500 mg. Patient encouraged to follow up with his PCP or return to the ER if needed.  She voices no other emergency medical complaints at this time. ____________________________________________   FINAL CLINICAL IMPRESSION(S) / ED DIAGNOSES  Final diagnoses:  Midline low back pain without sciatica      Arlyss Repress, PA-C 12/26/14 1524  Lavonia Drafts, MD 12/26/14 1525

## 2014-12-26 NOTE — ED Notes (Signed)
Pt reports middle and upper back. Currently receiving rehab for back. Injured back earlier this year and increased pain.

## 2014-12-26 NOTE — Discharge Instructions (Signed)
Back Pain, Adult Low back pain is very common. About 1 in 5 people have back pain.The cause of low back pain is rarely dangerous. The pain often gets better over time.About half of people with a sudden onset of back pain feel better in just 2 weeks. About 8 in 10 people feel better by 6 weeks.  CAUSES Some common causes of back pain include:  Strain of the muscles or ligaments supporting the spine.  Wear and tear (degeneration) of the spinal discs.  Arthritis.  Direct injury to the back. DIAGNOSIS Most of the time, the direct cause of low back pain is not known.However, back pain can be treated effectively even when the exact cause of the pain is unknown.Answering your caregiver's questions about your overall health and symptoms is one of the most accurate ways to make sure the cause of your pain is not dangerous. If your caregiver needs more information, he or she may order lab work or imaging tests (X-rays or MRIs).However, even if imaging tests show changes in your back, this usually does not require surgery. HOME CARE INSTRUCTIONS For many people, back pain returns.Since low back pain is rarely dangerous, it is often a condition that people can learn to manageon their own.   Remain active. It is stressful on the back to sit or stand in one place. Do not sit, drive, or stand in one place for more than 30 minutes at a time. Take short walks on level surfaces as soon as pain allows.Try to increase the length of time you walk each day.  Do not stay in bed.Resting more than 1 or 2 days can delay your recovery.  Do not avoid exercise or work.Your body is made to move.It is not dangerous to be active, even though your back may hurt.Your back will likely heal faster if you return to being active before your pain is gone.  Pay attention to your body when you bend and lift. Many people have less discomfortwhen lifting if they bend their knees, keep the load close to their bodies,and  avoid twisting. Often, the most comfortable positions are those that put less stress on your recovering back.  Find a comfortable position to sleep. Use a firm mattress and lie on your side with your knees slightly bent. If you lie on your back, put a pillow under your knees.  Only take over-the-counter or prescription medicines as directed by your caregiver. Over-the-counter medicines to reduce pain and inflammation are often the most helpful.Your caregiver may prescribe muscle relaxant drugs.These medicines help dull your pain so you can more quickly return to your normal activities and healthy exercise.  Put ice on the injured area.  Put ice in a plastic bag.  Place a towel between your skin and the bag.  Leave the ice on for 15-20 minutes, 03-04 times a day for the first 2 to 3 days. After that, ice and heat may be alternated to reduce pain and spasms.  Ask your caregiver about trying back exercises and gentle massage. This may be of some benefit.  Avoid feeling anxious or stressed.Stress increases muscle tension and can worsen back pain.It is important to recognize when you are anxious or stressed and learn ways to manage it.Exercise is a great option. SEEK MEDICAL CARE IF:  You have pain that is not relieved with rest or medicine.  You have pain that does not improve in 1 week.  You have new symptoms.  You are generally not feeling well. SEEK   IMMEDIATE MEDICAL CARE IF:   You have pain that radiates from your back into your legs.  You develop new bowel or bladder control problems.  You have unusual weakness or numbness in your arms or legs.  You develop nausea or vomiting.  You develop abdominal pain.  You feel faint. Document Released: 05/01/2005 Document Revised: 10/31/2011 Document Reviewed: 09/02/2013 Exit[employer] Patient Information 2015 [employer]. This information is not intended to replace advice given to you by your health care provider. Make sure you  discuss any questions you have with your health care provider.  

## 2015-01-24 ENCOUNTER — Emergency Department: Payer: 59

## 2015-01-24 ENCOUNTER — Inpatient Hospital Stay: Payer: 59

## 2015-01-24 ENCOUNTER — Encounter: Payer: Self-pay | Admitting: *Deleted

## 2015-01-24 ENCOUNTER — Inpatient Hospital Stay
Admission: EM | Admit: 2015-01-24 | Discharge: 2015-01-27 | DRG: 386 | Disposition: A | Discharge status: Home / Self Care | Payer: 59 | Attending: General Surgery | Admitting: General Surgery

## 2015-01-24 DIAGNOSIS — K56609 Unspecified intestinal obstruction, unspecified as to partial versus complete obstruction: Secondary | ICD-10-CM | POA: Diagnosis present

## 2015-01-24 DIAGNOSIS — E876 Hypokalemia: Secondary | ICD-10-CM | POA: Diagnosis present

## 2015-01-24 DIAGNOSIS — Z9049 Acquired absence of other specified parts of digestive tract: Secondary | ICD-10-CM | POA: Diagnosis present

## 2015-01-24 DIAGNOSIS — I10 Essential (primary) hypertension: Secondary | ICD-10-CM | POA: Diagnosis present

## 2015-01-24 DIAGNOSIS — Z91041 Radiographic dye allergy status: Secondary | ICD-10-CM | POA: Diagnosis not present

## 2015-01-24 DIAGNOSIS — K5090 Crohn's disease, unspecified, without complications: Secondary | ICD-10-CM

## 2015-01-24 DIAGNOSIS — Z9889 Other specified postprocedural states: Secondary | ICD-10-CM

## 2015-01-24 DIAGNOSIS — K50012 Crohn's disease of small intestine with intestinal obstruction: Secondary | ICD-10-CM | POA: Diagnosis present

## 2015-01-24 DIAGNOSIS — R51 Headache: Secondary | ICD-10-CM | POA: Diagnosis present

## 2015-01-24 DIAGNOSIS — K5669 Other intestinal obstruction: Secondary | ICD-10-CM | POA: Diagnosis present

## 2015-01-24 DIAGNOSIS — K50912 Crohn's disease, unspecified, with intestinal obstruction: Secondary | ICD-10-CM | POA: Diagnosis not present

## 2015-01-24 DIAGNOSIS — R109 Unspecified abdominal pain: Secondary | ICD-10-CM

## 2015-01-24 LAB — CBC
HCT: 37.7 % — ABNORMAL LOW (ref 40.0–52.0)
HEMOGLOBIN: 12.6 g/dL — AB (ref 13.0–18.0)
MCH: 26.8 pg (ref 26.0–34.0)
MCHC: 33.5 g/dL (ref 32.0–36.0)
MCV: 80.2 fL (ref 80.0–100.0)
Platelets: 275 10*3/uL (ref 150–440)
RBC: 4.7 MIL/uL (ref 4.40–5.90)
RDW: 14.9 % — ABNORMAL HIGH (ref 11.5–14.5)
WBC: 9.9 10*3/uL (ref 3.8–10.6)

## 2015-01-24 LAB — COMPREHENSIVE METABOLIC PANEL
ALBUMIN: 4.2 g/dL (ref 3.5–5.0)
ALK PHOS: 92 U/L (ref 38–126)
ALT: 18 U/L (ref 17–63)
ANION GAP: 8 (ref 5–15)
AST: 19 U/L (ref 15–41)
BILIRUBIN TOTAL: 0.9 mg/dL (ref 0.3–1.2)
BUN: 16 mg/dL (ref 6–20)
CALCIUM: 9.5 mg/dL (ref 8.9–10.3)
CO2: 29 mmol/L (ref 22–32)
Chloride: 103 mmol/L (ref 101–111)
Creatinine, Ser: 1.25 mg/dL — ABNORMAL HIGH (ref 0.61–1.24)
GFR calc Af Amer: >60 mL/min (ref >60)
GFR calc non Af Amer: >60 mL/min (ref >60)
Glucose, Bld: 122 mg/dL — ABNORMAL HIGH (ref 65–99)
Potassium: 3 mmol/L — ABNORMAL LOW (ref 3.5–5.1)
Sodium: 140 mmol/L (ref 135–145)
TOTAL PROTEIN: 7.3 g/dL (ref 6.5–8.1)

## 2015-01-24 LAB — URINALYSIS COMPLETE WITH MICROSCOPIC (ARMC ONLY)
BILIRUBIN URINE: NEGATIVE
Bacteria, UA: NONE SEEN
GLUCOSE, UA: NEGATIVE mg/dL
Hgb urine dipstick: NEGATIVE
KETONES UR: NEGATIVE mg/dL
Leukocytes, UA: NEGATIVE
NITRITE: NEGATIVE
Protein, ur: NEGATIVE mg/dL
Specific Gravity, Urine: 1.023 (ref 1.005–1.030)
pH: 5 (ref 5.0–8.0)

## 2015-01-24 LAB — LIPASE, BLOOD: Lipase: 17 U/L — ABNORMAL LOW (ref 22–51)

## 2015-01-24 MED ORDER — MORPHINE SULFATE (PF) 4 MG/ML IV SOLN
4.0000 mg | Freq: Once | INTRAVENOUS | Status: AC
  Administered 2015-01-24: 4 mg via INTRAVENOUS
  Filled 2015-01-24: qty 1

## 2015-01-24 MED ORDER — ONDANSETRON 4 MG PO TBDP: 4.0000 mg | ORAL_TABLET | Freq: Four times a day (QID) | ORAL | Status: DC | PRN

## 2015-01-24 MED ORDER — DIPHENHYDRAMINE HCL 25 MG PO CAPS: 25.0000 mg | ORAL_CAPSULE | Freq: Four times a day (QID) | ORAL | Status: DC | PRN

## 2015-01-24 MED ORDER — HYDROMORPHONE HCL 1 MG/ML IJ SOLN
INTRAMUSCULAR | Status: AC
  Administered 2015-01-24: 1 mg via INTRAVENOUS
  Filled 2015-01-24: qty 1

## 2015-01-24 MED ORDER — PANTOPRAZOLE SODIUM 40 MG IV SOLR
40.0000 mg | Freq: Every day | INTRAVENOUS | Status: DC
  Administered 2015-01-24 – 2015-01-26 (×3): 40 mg via INTRAVENOUS
  Filled 2015-01-24 (×3): qty 40

## 2015-01-24 MED ORDER — HYDROMORPHONE HCL 1 MG/ML IJ SOLN
1.0000 mg | Freq: Once | INTRAMUSCULAR | Status: AC
  Administered 2015-01-24: 1 mg via INTRAVENOUS

## 2015-01-24 MED ORDER — LACTATED RINGERS IV SOLN
INTRAVENOUS | Status: DC
  Administered 2015-01-24 – 2015-01-27 (×7): via INTRAVENOUS

## 2015-01-24 MED ORDER — SODIUM CHLORIDE 0.9 % IV BOLUS (SEPSIS)
1000.0000 mL | Freq: Once | INTRAVENOUS | Status: AC
  Administered 2015-01-24: 1000 mL via INTRAVENOUS

## 2015-01-24 MED ORDER — ONDANSETRON HCL 4 MG/2ML IJ SOLN
4.0000 mg | Freq: Four times a day (QID) | INTRAMUSCULAR | Status: DC | PRN
  Administered 2015-01-24: 4 mg via INTRAVENOUS
  Filled 2015-01-24: qty 2

## 2015-01-24 MED ORDER — POTASSIUM CHLORIDE 10 MEQ/100ML IV SOLN
10.0000 meq | INTRAVENOUS | Status: AC
  Administered 2015-01-24 (×4): 10 meq via INTRAVENOUS
  Filled 2015-01-24 (×4): qty 100

## 2015-01-24 MED ORDER — ONDANSETRON HCL 4 MG/2ML IJ SOLN
4.0000 mg | Freq: Once | INTRAMUSCULAR | Status: AC
  Administered 2015-01-24: 4 mg via INTRAVENOUS
  Filled 2015-01-24: qty 2

## 2015-01-24 MED ORDER — METHYLPREDNISOLONE SODIUM SUCC 40 MG IJ SOLR
20.0000 mg | Freq: Three times a day (TID) | INTRAMUSCULAR | Status: DC
  Administered 2015-01-24 – 2015-01-26 (×7): 20 mg via INTRAVENOUS
  Filled 2015-01-24 (×7): qty 1

## 2015-01-24 MED ORDER — MORPHINE SULFATE (PF) 4 MG/ML IV SOLN
4.0000 mg | INTRAVENOUS | Status: DC | PRN
  Administered 2015-01-24 – 2015-01-26 (×8): 4 mg via INTRAVENOUS
  Filled 2015-01-24 (×8): qty 1

## 2015-01-24 MED ORDER — LIDOCAINE HCL 2 % EX GEL
1.0000 "application " | Freq: Once | CUTANEOUS | Status: AC
  Administered 2015-01-24: 1

## 2015-01-24 MED ORDER — LIDOCAINE HCL 2 % EX GEL
CUTANEOUS | Status: AC
  Administered 2015-01-24: 1
  Filled 2015-01-24: qty 10

## 2015-01-24 MED ORDER — ENOXAPARIN SODIUM 40 MG/0.4ML ~~LOC~~ SOLN
40.0000 mg | SUBCUTANEOUS | Status: DC
  Administered 2015-01-25 – 2015-01-27 (×3): 40 mg via SUBCUTANEOUS
  Filled 2015-01-24 (×3): qty 0.4

## 2015-01-24 MED ORDER — DIPHENHYDRAMINE HCL 50 MG/ML IJ SOLN: 25.0000 mg | Freq: Four times a day (QID) | INTRAMUSCULAR | Status: DC | PRN

## 2015-01-24 MED ORDER — HYDRALAZINE HCL 20 MG/ML IJ SOLN: 10.0000 mg | INTRAMUSCULAR | Status: DC | PRN

## 2015-01-24 NOTE — Consult Note (Signed)
Reason for Consult:  Chief Complaint  Patient presents with  . Abdominal Pain   Referring Physician: dr . Spencer Heath is an 49 y.o. male.  HPI: Spencer Heath is a 49 year old African-American male with known history of Crohn's disease and hypertension came into the ED with a chief complaint of one day history of worsening of abdominal pain associated with chills and nausea.  Patient is admitted to the surgical service and hospitalist team is called for medical management. During my examination he denies any chest pain or shortness of breath. Reporting abdominal discomfort but denies any vomiting Past Medical History  Diagnosis Date  . Hypertension   . Crohn's disease     Past Surgical History  Procedure Laterality Date  . Abdominal surgery    . Testicle surgery    . Appendectomy    . Testical removed      History reviewed. No pertinent family history.  Social History:  reports that he has never smoked. He has never used smokeless tobacco. He reports that he does not drink alcohol or use illicit drugs.  Allergies:  Allergies  Allergen Reactions  . Ivp Dye [Iodinated Diagnostic Agents] Hives    Medications: I have reviewed the patient's current medications.  Results for orders placed or performed during the hospital encounter of 01/24/15 (from the past 48 hour(s))  Lipase, blood     Status: Abnormal   Collection Time: 01/24/15  2:30 AM  Result Value Ref Range   Lipase 17 (L) 22 - 51 U/L  Comprehensive metabolic panel     Status: Abnormal   Collection Time: 01/24/15  2:30 AM  Result Value Ref Range   Sodium 140 135 - 145 mmol/L   Potassium 3.0 (L) 3.5 - 5.1 mmol/L   Chloride 103 101 - 111 mmol/L   CO2 29 22 - 32 mmol/L   Glucose, Bld 122 (H) 65 - 99 mg/dL   BUN 16 6 - 20 mg/dL   Creatinine, Ser 1.25 (H) 0.61 - 1.24 mg/dL   Calcium 9.5 8.9 - 10.3 mg/dL   Total Protein 7.3 6.5 - 8.1 g/dL   Albumin 4.2 3.5 - 5.0 g/dL   AST 19 15 - 41 U/L   ALT 18 17 -  63 U/L   Alkaline Phosphatase 92 38 - 126 U/L   Total Bilirubin 0.9 0.3 - 1.2 mg/dL   GFR calc non Af Amer >60 >60 mL/min   GFR calc Af Amer >60 >60 mL/min    Comment: (NOTE) The eGFR has been calculated using the CKD EPI equation. This calculation has not been validated in all clinical situations. eGFR's persistently <60 mL/min signify possible Chronic Kidney Disease.    Anion gap 8 5 - 15  CBC     Status: Abnormal   Collection Time: 01/24/15  2:30 AM  Result Value Ref Range   WBC 9.9 3.8 - 10.6 K/uL   RBC 4.70 4.40 - 5.90 MIL/uL   Hemoglobin 12.6 (L) 13.0 - 18.0 g/dL   HCT 37.7 (L) 40.0 - 52.0 %   MCV 80.2 80.0 - 100.0 fL   MCH 26.8 26.0 - 34.0 pg   MCHC 33.5 32.0 - 36.0 g/dL   RDW 14.9 (H) 11.5 - 14.5 %   Platelets 275 150 - 440 K/uL  Urinalysis complete, with microscopic ([facility] only)     Status: Abnormal   Collection Time: 01/24/15  2:30 AM  Result Value Ref Range   Color, Urine YELLOW (A) YELLOW  APPearance CLEAR (A) CLEAR   Glucose, UA NEGATIVE NEGATIVE mg/dL   Bilirubin Urine NEGATIVE NEGATIVE   Ketones, ur NEGATIVE NEGATIVE mg/dL   Specific Gravity, Urine 1.023 1.005 - 1.030   Hgb urine dipstick NEGATIVE NEGATIVE   pH 5.0 5.0 - 8.0   Protein, ur NEGATIVE NEGATIVE mg/dL   Nitrite NEGATIVE NEGATIVE   Leukocytes, UA NEGATIVE NEGATIVE   RBC / HPF 0-5 0 - 5 RBC/hpf   WBC, UA 0-5 0 - 5 WBC/hpf   Bacteria, UA NONE SEEN NONE SEEN   Squamous Epithelial / LPF 0-5 (A) NONE SEEN   Mucous PRESENT     Ct Abdomen Pelvis Wo Contrast  01/24/2015   CLINICAL DATA:  Sharp abdominal pains beginning at 7 p.m. Saturday. History of Crohn's disease. History of intestinal surgery 30 years ago.  EXAM: CT ABDOMEN AND PELVIS WITHOUT CONTRAST  TECHNIQUE: Multidetector CT imaging of the abdomen and pelvis was performed following the standard protocol without IV contrast.  COMPARISON:  03/16/2013  FINDINGS: Atelectasis in the lung bases.  Evaluation of solid organs and vascular structures is  limited without IV contrast material. There is a small sliver of free fluid around the anterior liver edge. The unenhanced appearance of the liver, spleen, gallbladder, pancreas, adrenal glands, kidneys, abdominal aorta, inferior vena cava, and retroperitoneal lymph nodes is unremarkable. Surgical clips in the right lower quadrant. Stomach and proximal small bowel are distended. Distal small bowel are decompressed. Transition zone appears to be in the lower mid abdomen. Appearance is consistent with high-grade small bowel obstruction. Infiltration or edema in the mesentery, suggesting inflammation. No bowel wall thickening is appreciated. No pneumatosis. Colon is decompressed. No free air in the abdomen.  Pelvis: Appendix is not identified. Prostate gland is not enlarged. Bladder wall is not thickened. No free or loculated pelvic fluid collections. No pelvic mass or lymphadenopathy. Degenerative changes in the spine.  IMPRESSION: Dilated proximal small bowel with decompressed distal small bowel consistent with small bowel obstruction. Infiltration or edema in the mesentery suggest inflammation. No definite wall thickening. Small amount of free fluid around the liver edge.   Electronically Signed   By: Lucienne Capers M.D.   On: 01/24/2015 04:52   Dg Abd 1 View  01/24/2015   CLINICAL DATA:  NG tube placement  EXAM: ABDOMEN - 1 VIEW  COMPARISON:  CT abdomen and pelvis 01/24/2015.  Abdomen 08/25/2013.  FINDINGS: Enteric tube tip is in the left upper quadrant consistent with location in the body of the stomach. Gas distended upper mid abdominal small bowel consistent with small bowel obstruction. Surgical clips in the right lower quadrant.  IMPRESSION: Enteric tube tip is in the left upper quadrant consistent with location in the gastric body. Gas distention of mid abdominal small bowel consistent with small bowel obstruction.   Electronically Signed   By: Lucienne Capers M.D.   On: 01/24/2015 06:08   Dg Abd 2  Views  01/24/2015   CLINICAL DATA:  49 year old male with a history of Crohn's disease. Worsening abdominal pain.  EXAM: ABDOMEN - 2 VIEW  COMPARISON:  01/24/2015, CT 01/24/2015, plain film 08/25/2013  FINDINGS: Air-fluid level of the stomach with retained enteric contrast, with multiple air-fluid levels of the mid small bowel loops. Configuration of partially dilated small bowel loops is unchanged from the comparison plain film and from the CT.  Paucity of colonic gas, particularly of the left colon and rectum.  Surgical clips within the right abdomen.  Unchanged position of gastric tube  terminating in the left upper quadrant.  IMPRESSION: Multiple air-fluid levels, with partially distended the small bowel loops in the mid abdomen. Configuration of gas is unchanged from comparison plain film and CT, concerning for obstruction or ileus, though the imaging studies on this admission are within 24 hours. Serial plain film imaging is recommended to follow-up this abnormal bowel gas pattern.  Unchanged gastric tube.  Signed,  Dulcy Fanny. Earleen Newport, DO  [facility]  [facility]   Electronically Signed   By: Corrie Mckusick D.O.   On: 01/24/2015 11:00    ROS: CONSTITUTIONAL: Denies fevers, chills. Denies any fatigue, weakness. Reporting abdominal discomfort EYES: Denies blurry vision, double vision, eye pain. EARS, NOSE, THROAT: Denies tinnitus, ear pain, hearing loss. RESPIRATORY: Denies cough, wheeze, shortness of breath.  CARDIOVASCULAR: Denies chest pain, palpitations, edema.  GASTROINTESTINAL: Reporting nausea and abdominal discomfort denies any flatus. Denies bright red blood per rectum. GENITOURINARY: Denies dysuria, hematuria. ENDOCRINE: Denies nocturia or thyroid problems. HEMATOLOGIC AND LYMPHATIC: Denies easy bruising or bleeding. SKIN: Denies rash or lesion. MUSCULOSKELETAL: Denies pain in neck, back, shoulder, knees, hips or arthritic symptoms.   NEUROLOGIC: Denies paralysis, paresthesias.  PSYCHIATRIC: Denies anxiety or depressive symptoms. Blood pressure 137/83, pulse 70, temperature 98.4 F (36.9 C), temperature source Oral, resp. rate 20, height 5' 5" (1.651 m), weight 99.791 kg (220 lb), SpO2 98 %.   PHYSICAL EXAMINATION:  GENERAL: Well-nourished, well-developed , currently Uncomfortable from abdominal discomfort. HEAD: Normocephalic, at.raumatic.  EYES: Pupils equal, round, and reactive to light. Extraocular muscles intact. No scleral icterus.  MOUTH: Moist mucosal membranes. Dentition intact. No abscess noted. EARS, NOSE, THROAT: Clear without exudates. No external lesions.  with NG tube  NECK: Supple. No thyromegaly. No nodules. No JVD.  PULMONARY: Clear to auscultation bilaterally without wheezes, rales, or rhonchi. No use of accessory muscles. Good respiratory effort. CHEST: Nontender to palpation.  CARDIOVASCULAR: S1, S2, regular rate and rhythm. No murmurs, rubs, or gallops.  GASTROINTESTINAL: distended. No  bowel sounds.  MUSCULOSKELETAL: No swelling, clubbing, edema. Range of motion full in all extremities. NEUROLOGIC: Cranial nerves II-XII intact. No gross focal neurological deficits. Sensation intact. Reflexes intact. SKIN: No ulcerations, lesions, rash, cyanosis. Skin warm, dry. Turgor intact. PSYCHIATRIC: Mood, affect within normal limits. Patient awake, alert, oriented x 3. Insight and judgment intact.   Assessment/Plan:  1. Hypertension Currently blood pressure is stable. Patient is nothing by mouth. We will hold his home medications and provide IV Lopressor as needed basis  2. History of Crohn's disease with no exacerbation Hold home medications, including steroids  3. Hypokalemia Supplement 40 mEq of IV potassium. Repeat CMP and magnesium in am  4. Small bowel obstruction Nothing by mouth, NG tube As per surgery    Thank you for allowing hospitalist team to take care of this patient for medical  management We will transfer the patient to Dr. Linton Ham service  TOTAL TIME TAKING CARE OF THIS PATIENT: 45 minutes.  _0 @ Pager - [phone] 01/24/2015, 12:06 PM

## 2015-01-24 NOTE — ED Notes (Signed)
Pt. States when he got home from work around pm Saturday, Pt. States sharp abdominal pain throughout night.  Pt. States hx of Chronos. Pt. Thinks his last flare up with chrons was last year.

## 2015-01-24 NOTE — H&P (Addendum)
Patient ID: Spencer Heath, male   DOB: July 24, 1965, 49 y.o.   MRN: [id number] CC: ABDOMINAL PAIN History of Present Illness Spencer Heath is a 49 y.o. male with known crohn's disease present to the ER with a 1 day history of progressively worsening abdominal pain. He states that he has had pain like this before many years ago when he had to have emergent surgery for a bowel perforation due to his Crohn's disease that was performed at [facility]. He states the pain is sharp and stabbing and primarily located on his lower abdomen. He also states that he does feel bloated. He admits to nausea but no vomiting as well as subjective chills. His last BM was 3 days ago and was small in amount. Denies any fevers, chest pain, shortness of breath, diarrhea. He states that prior to yesterday he was in his usual state of health.  Past Medical History Past Medical History  Diagnosis Date  . Hypertension   . Crohn's disease      Reviewed and updated  Past Surgical History  Procedure Laterality Date  . Abdominal surgery    . Testicle surgery      Allergies  Allergen Reactions  . Ivp Dye [Iodinated Diagnostic Agents] Hives    Current Facility-Administered Medications  Medication Dose Route Frequency Provider Last Rate Last Dose  . lidocaine (XYLOCAINE) 2 % jelly            Current Outpatient Prescriptions  Medication Sig Dispense Refill  . amitriptyline (ELAVIL) 10 MG tablet Take 10 mg by mouth Nightly.    Marland Kitchen amLODipine (NORVASC) 5 MG tablet Take 5 mg by mouth daily at 6 (six) AM.    . budesonide (ENTOCORT EC) 3 MG 24 hr capsule Take 6 mg by mouth daily at 6 (six) AM.    . Capsaicin 0.1 % CREA Apply to needed area twice daily. 1 Tube 3  . Cetirizine HCl 10 MG CAPS Take 10 mg by mouth daily at 6 (six) AM.    . cyclobenzaprine (FLEXERIL) 5 MG tablet Take 5 mg by mouth 3 (three) times daily as needed.    . etodolac (LODINE) 400 MG tablet Take 1 tablet (400 mg total) by mouth 2 (two) times daily. 20  tablet 0  . hydrochlorothiazide (HYDRODIURIL) 25 MG tablet Take 25 mg by mouth daily at 6 (six) AM.    . lisinopril (PRINIVIL,ZESTRIL) 40 MG tablet Take 40 mg by mouth daily at 6 (six) AM.    . meloxicam (MOBIC) 15 MG tablet Take 15 mg by mouth daily at 6 (six) AM.    . methocarbamol (ROBAXIN) 500 MG tablet Take 1 tablet (500 mg total) by mouth every 6 (six) hours as needed for muscle spasms. 30 tablet 0  . pantoprazole (PROTONIX) 40 MG tablet Take 40 mg by mouth daily at 6 (six) AM.    . potassium chloride (K-DUR,KLOR-CON) 10 MEQ tablet Take one tablet per day x 5 days starting Friday 5 tablet 0  . predniSONE (DELTASONE) 10 MG tablet Take 1 tablet (10 mg total) by mouth daily. 6,5,4,3,2,1 six day taper 21 tablet 0  . traMADol (ULTRAM) 50 MG tablet Take 1 tablet (50 mg total) by mouth every 6 (six) hours as needed. 20 tablet 0  . traMADol (ULTRAM) 50 MG tablet Take 50 mg by mouth every 6 (six) hours as needed.    . Vitamin D, Ergocalciferol, (DRISDOL) 50000 UNITS CAPS capsule Take 1 capsule by mouth once a week.  Family History No family history on file.  Patient states that his mother has kidney problems, otherwise he is unaware of any family medical problems  Social History Social History  Substance Use Topics  . Smoking status: Never Smoker   . Smokeless tobacco: Never Used  . Alcohol Use: No     Reviewed again   ROS  A multipoint ROS was completed. All pertinent positives and negatives were within the HPI. The remainder were negative.   Physical Exam Blood pressure 127/79, pulse 70, temperature 98.6 F (37 C), temperature source Oral, resp. rate 20, height 5' 5"  (1.651 m), weight 99.791 kg (220 lb), SpO2 100 %.  CONSTITUTIONAL: Resting in bed in no acute distress EYES: Pupils equal, round, and reactive to light, Sclera non-icteric. EARS, NOSE, MOUTH AND THROAT: The oropharynx is clear. Oral mucosa is pink and moist. Hearing is intact to voice.  NECK: Trachea is midline,  and there is no jugular venous distension. Thyroid is without palpable abnormalities. LYMPH NODES:  Lymph nodes in the neck are not enlarged. RESPIRATORY:  Lungs are clear, and breath sounds are equal bilaterally. Normal respiratory effort without pathologic use of accessory muscles. CARDIOVASCULAR: Heart is regular without murmurs, gallops, or rubs. GI: The abdomen is soft, tender to palpation in the lower abdomen, and mildly distended. Well healed midline incision.There were no palpable masses. There was no hepatosplenomegaly. There were normal bowel sounds. GU: deferred MUSCULOSKELETAL:  Normal muscle strength and tone in all four extremities.    SKIN: Skin turgor is normal. There are no pathologic skin lesions.  NEUROLOGIC:  Motor and sensation is grossly normal.  Cranial nerves are grossly intact. PSYCH:  Alert and oriented to person, place and time. Affect is normal.  Data Reviewed Labs reviewed and only significant for mildly elevated Creatinine and low K Images reviewed, CT concerning for bowel obstruction. Contrast does not progress very far into small intestine with dilated small intestine progressing to decompressed small intestine in the lower midline.  I have personally reviewed the patient's imaging and medical records.    Assessment    49 year old male with small bowel obstruction  Plan    Discussed with patient his findings of a small bowel obstruction and that it is difficult to say if this is secondary to a crohn's flare versus adhesive disease from his prior surgeries. No evidence of perforation and no clear evidence of inflammatory stricture on imaging. Regardless of the etiology, discussed that he will need admission. Will attempt NGT decompression and provide IV hydration and pain control. Will consult internal medicine for assistance with his BP medications while NPO and will also consult GI for assistance with his Crohn's management. Patient has been on chronic steroids  for his Crohn's.  Face-to-face time spent with the patient and care providers was 30 minutes, with more than 50% of the time spent counseling, educating, and coordinating care of the patient.     Clayburn Pert 01/24/2015, 6:09 AM

## 2015-01-24 NOTE — Progress Notes (Signed)
Patient ID: Spencer Heath, male   DOB: 1966/01/07, 49 y.o.   MRN: [id number]   Patient seen on floor. CT scan and plain films reviewed from this morning. He has not passed any gas. Nasogastric tube is consistent with oral contrast given earlier this morning. On examination the patient is alert and oriented. His abdomen is distended somewhat tender and tympanitic with high-pitched bowel sounds. There is a midline scar. No clear hernia is noted. There is no peritoneal signs.  Laboratory values were reviewed.  Impression likely adhesive small bowel obstruction. Again I do not obviously see any evidence of Crohn's disease on the CT scan imaging.  Plan for now will be continued nasogastric tube decompression and intravenous fluids and narcotics. We will reexamine. I discussed with the patient and his wife and daughter present in the room the indications for surgery namely failure of medical management.

## 2015-01-24 NOTE — Progress Notes (Signed)
GI Inpatient Consult Note  Reason for Consult: SBO, crohn's disease   Attending Requesting Consult: Dr Marina Gravel  History of Present Illness: Spencer Heath is a 49 y.o. male with past medical history notable for Crohn's disease status post small bowel resection in his teens, maintained on budesonide who is presenting for evaluation of acute onset abdominal pain. CT in the emergency room was suggestive of a high-grade small bowel obstruction. He is being managed conservatively with NG tube decompression.  Per Spencer Heath, he was associated in his usual state of health until yesterday morning when he had relatively sudden onset of acute diffuse abdominal pain. He had not had a bowel movement for several days but actually did have 1 bowel movement yesterday morning. Prior to these symptoms, he did not have any change in his bowel habits. He moves his bowels about 2 times a day. He has not had any issues with diarrhea, rectal bleeding, melena, fevers or chills, weight loss.  Spencer Heath is followed for his Crohn's in the [facility]. He reports being on just budesonide for many years. He has not had any recent Crohn's flares.  Past Medical History:  Past Medical History  Diagnosis Date  . Hypertension   . Crohn's disease     Problem List: Patient Active Problem List   Diagnosis Date Noted  . Small bowel obstruction 01/24/2015  . Crohn's disease 01/24/2015    Past Surgical History: Past Surgical History  Procedure Laterality Date  . Abdominal surgery    . Testicle surgery    . Appendectomy    . Testical removed      Allergies: Allergies  Allergen Reactions  . Ivp Dye [Iodinated Diagnostic Agents] Hives    Home Medications: Prescriptions prior to admission  Medication Sig Dispense Refill Last Dose  . amitriptyline (ELAVIL) 10 MG tablet Take 10 mg by mouth Nightly.   01/23/2015 at Unknown time  . amLODipine (NORVASC) 5 MG tablet Take 5 mg by mouth daily at 6 (six) AM.    01/23/2015 at Unknown time  . budesonide (ENTOCORT EC) 3 MG 24 hr capsule Take 6 mg by mouth daily at 6 (six) AM.   01/23/2015 at Unknown time  . Capsaicin 0.1 % CREA Apply to needed area twice daily. 1 Tube 3 01/23/2015 at Unknown time  . hydrochlorothiazide (HYDRODIURIL) 25 MG tablet Take 25 mg by mouth daily at 6 (six) AM.   01/23/2015 at Unknown time  . lisinopril (PRINIVIL,ZESTRIL) 40 MG tablet Take 40 mg by mouth daily at 6 (six) AM.   01/23/2015 at Unknown time  . pantoprazole (PROTONIX) 40 MG tablet Take 40 mg by mouth daily at 6 (six) AM.   01/23/2015 at Unknown time  . etodolac (LODINE) 400 MG tablet Take 1 tablet (400 mg total) by mouth 2 (two) times daily. (Patient not taking: Reported on 01/24/2015) 20 tablet 0 Not Taking at Unknown time  . predniSONE (DELTASONE) 10 MG tablet Take 1 tablet (10 mg total) by mouth daily. 6,5,4,3,2,1 six day taper (Patient not taking: Reported on 01/24/2015) 21 tablet 0 Completed Course at Unknown time   Home medication reconciliation was completed with the patient.   Scheduled Inpatient Medications:   . enoxaparin (LOVENOX) injection  40 mg Subcutaneous Q24H  . pantoprazole (PROTONIX) IV  40 mg Intravenous QHS  . potassium chloride  10 mEq Intravenous Q1 Hr x 4    Continuous Inpatient Infusions:   . lactated ringers 150 mL/hr at 01/24/15 1425  PRN Inpatient Medications:  diphenhydrAMINE **OR** diphenhydrAMINE, hydrALAZINE, morphine injection, ondansetron **OR** ondansetron (ZOFRAN) IV  Family History: The patient's family history is negative for inflammatory bowel disorders, GI malignancy, or solid organ transplantation.  Social History:   reports that he has never smoked. He has never used smokeless tobacco. He reports that he does not drink alcohol or use illicit drugs.   Review of Systems: Constitutional: Weight is stable.  Eyes: No changes in vision. ENT: No oral lesions, sore throat.  GI: see HPI.  Heme/Lymph: No easy bruising.  CV: No  chest pain.  GU: No hematuria.  Integumentary: No rashes.  Neuro: No headaches.  Psych: No depression/anxiety.  Endocrine: No heat/cold intolerance.  Allergic/Immunologic: No urticaria.  Resp: No cough, SOB.  Musculoskeletal: No joint swelling.    Physical Examination: BP 121/76 mmHg  Pulse 63  Temp(Src) 98.4 F (36.9 C) (Oral)  Resp 16  Ht 5' 5"  (1.651 m)  Wt 99.791 kg (220 lb)  BMI 36.61 kg/m2  SpO2 94% Gen: NAD, alert and oriented x 4 HEENT: PEERLA, EOMI, + NG tube Neck: supple, no JVD or thyromegaly Chest: CTA bilaterally, no wheezes, crackles, or other adventitious sounds CV: RRR, no m/g/c/r Abd: + difuse distension, min bowel sounds,  Sig diffuse TTP, no r/g.  + mid-line surg scar.  Ext: no edema, well perfused with 2+ pulses, Skin: no rash or lesions noted Lymph: no LAD  Data: Lab Results  Component Value Date   WBC 9.9 01/24/2015   HGB 12.6* 01/24/2015   HCT 37.7* 01/24/2015   MCV 80.2 01/24/2015   PLT 275 01/24/2015    Recent Labs Lab 01/24/15 0230  HGB 12.6*   Lab Results  Component Value Date   NA 140 01/24/2015   K 3.0* 01/24/2015   CL 103 01/24/2015   CO2 29 01/24/2015   BUN 16 01/24/2015   CREATININE 1.25* 01/24/2015   Lab Results  Component Value Date   ALT 18 01/24/2015   AST 19 01/24/2015   ALKPHOS 92 01/24/2015   BILITOT 0.9 01/24/2015   No results for input(s): APTT, INR, PTT in the last 168 hours.   Assessment/Plan: Spencer Heath is a 49 y.o. male with Crohn's disease maintained on budesonide who is presenting for evaluation of small bowel obstruction. The small bowel obstruction is likely caused by fibrosed stenotic disease but there may be a component of inflammation that we are just not able to see on the CT scan. Therefore I would recommend adding Solu-Medrol IV 20 mg every 8 hours to help with any inflammation that may be contributing to the small bowel obstruction.Will also check inflammatory markers     I would also strongly  consider small bowel imaging as an outpatient such as MR enterography or CT enterography.  If there is evidence of disease on this study, would escalate therapy therapy to a biologic. Management of the small bowel obstruction will be per the surgical team at this time.  Will follow.  Thank you for the consult. Please call with questions or concerns.  Sima Lindenberger, Grace Blight, MD

## 2015-01-24 NOTE — ED Notes (Signed)
Patient transported to CT 

## 2015-01-24 NOTE — ED Provider Notes (Signed)
[facility] Emergency Department Provider Note  ____________________________________________  Time seen: Approximately 2:26 AM  I have reviewed the triage vital signs and the nursing notes.   HISTORY  Chief Complaint Abdominal Pain    HPI Spencer Heath is a 49 y.o. male with hypertension, back pain, Crohn's disease, "surgery on my intestines for Crohn's" who presents for evaluation of gradual onset diffuse cramping lower abdominal pain since 7 PM last night. Currently his pain is severe. No modifying factors. No chest pain or difficulty breathing. No fevers. He has been compliant with his Crohn's medications.   Past Medical History  Diagnosis Date  . Hypertension   . Crohn's disease     There are no active problems to display for this patient.   Past Surgical History  Procedure Laterality Date  . Abdominal surgery    . Testicle surgery      Current Outpatient Rx  Name  Route  Sig  Dispense  Refill  . Capsaicin 0.1 % CREA      Apply to needed area twice daily.   1 Tube   3   . etodolac (LODINE) 400 MG tablet   Oral   Take 1 tablet (400 mg total) by mouth 2 (two) times daily.   20 tablet   0   . methocarbamol (ROBAXIN) 500 MG tablet   Oral   Take 1 tablet (500 mg total) by mouth every 6 (six) hours as needed for muscle spasms.   30 tablet   0   . potassium chloride (K-DUR,KLOR-CON) 10 MEQ tablet      Take one tablet per day x 5 days starting Friday   5 tablet   0   . predniSONE (DELTASONE) 10 MG tablet   Oral   Take 1 tablet (10 mg total) by mouth daily. 6,5,4,3,2,1 six day taper   21 tablet   0   . traMADol (ULTRAM) 50 MG tablet   Oral   Take 1 tablet (50 mg total) by mouth every 6 (six) hours as needed.   20 tablet   0     Allergies Ivp dye  No family history on file.  Social History Social History  Substance Use Topics  . Smoking status: Never Smoker   . Smokeless tobacco: Never Used  . Alcohol Use: No     Review of Systems Constitutional: No fever/chills Eyes: No visual changes. ENT: No sore throat. Cardiovascular: Denies chest pain. Respiratory: Denies shortness of breath. Gastrointestinal: + abdominal pain.  No nausea, no vomiting.  No diarrhea.  No constipation. Genitourinary: Negative for dysuria. Musculoskeletal: Negative for back pain. Skin: Negative for rash. Neurological: Negative for headaches, focal weakness or numbness.  10-point ROS otherwise negative.  ____________________________________________   PHYSICAL EXAM:  VITAL SIGNS: ED Triage Vitals  Enc Vitals Group     BP 01/24/15 0213 131/77 mmHg     Pulse Rate 01/24/15 0213 77     Resp 01/24/15 0213 20     Temp 01/24/15 0213 98.6 F (37 C)     Temp Source 01/24/15 0213 Oral     SpO2 01/24/15 0213 97 %     Weight 01/24/15 0212 220 lb (99.791 kg)     Height 01/24/15 0212 5' 5"  (1.651 m)     Head Cir --      Peak Flow --      Pain Score 01/24/15 0212 10     Pain Loc --      Pain Edu? --  Excl. in [place]? --     Constitutional: Alert and oriented. In distress secondary to pain. Eyes: Conjunctivae are normal. PERRL. EOMI. Head: Atraumatic. Nose: No congestion/rhinnorhea. Mouth/Throat: Mucous membranes are moist.  Oropharynx non-erythematous. Neck: No stridor. Cardiovascular: Normal rate, regular rhythm. Grossly normal heart sounds.  Good peripheral circulation. Respiratory: Normal respiratory effort.  No retractions. Lungs CTAB. Gastrointestinal: Diminished bowel sounds, diffusely tender, mildly distended. Genitourinary: deferred Musculoskeletal: No lower extremity tenderness nor edema.  No joint effusions. Neurologic:  Normal speech and language. No gross focal neurologic deficits are appreciated. No gait instability. Skin:  Skin is warm, dry and intact. No rash noted. Psychiatric: Mood and affect are normal. Speech and behavior are normal.  ____________________________________________   LABS (all  labs ordered are listed, but only abnormal results are displayed)  Labs Reviewed  LIPASE, BLOOD - Abnormal; Notable for the following:    Lipase 17 (*)    All other components within normal limits  COMPREHENSIVE METABOLIC PANEL - Abnormal; Notable for the following:    Potassium 3.0 (*)    Glucose, Bld 122 (*)    Creatinine, Ser 1.25 (*)    All other components within normal limits  CBC - Abnormal; Notable for the following:    Hemoglobin 12.6 (*)    HCT 37.7 (*)    RDW 14.9 (*)    All other components within normal limits  URINALYSIS COMPLETEWITH MICROSCOPIC ([facility] ONLY)   ____________________________________________  EKG  none ____________________________________________  RADIOLOGY  CT abdomen and pelvis IMPRESSION: Dilated proximal small bowel with decompressed distal small bowel consistent with small bowel obstruction. Infiltration or edema in the mesentery suggest inflammation. No definite wall thickening. Small amount of free fluid around the liver edge.   ____________________________________________   PROCEDURES  Procedure(s) performed: None  Critical Care performed: No  ____________________________________________   INITIAL IMPRESSION / ASSESSMENT AND PLAN / ED COURSE  Pertinent labs & imaging results that were available during my care of the patient were reviewed by me and considered in my medical decision making (see chart for details).  Spencer Heath is a 49 y.o. male with hypertension, back pain, Crohn's disease, "surgery on my intestines for Crohn's" who presents for evaluation of gradual onset diffuse cramping lower abdominal pain. On exam, he is in distress secondary to pain. Vital signs stable, he is afebrile. He has diminished bowel sounds with diffuse abdominal tenderness and distention. Plan for screening labs, pain control, antiemetics and likely CT of the abdomen and pelvis to evaluate for Crohn's complication,perforation, abscess,  obstruction.  ----------------------------------------- 5:04 AM on 01/24/2015 -----------------------------------------  Labs notable for very mild anemia. Mild hypokalemia. Mild elevation of his creatinine at 1.25. Normal lipase. CT of the abdomen and pelvis concerning for acute small bowel obstruction. I've ordered NG tube and discussed the case with Dr. Adonis Huguenin of general injury who will evaluate and admit. ____________________________________________   FINAL CLINICAL IMPRESSION(S) / ED DIAGNOSES  Final diagnoses:  Abdominal pain, unspecified abdominal location  Small bowel obstruction      Joanne Gavel, MD 01/24/15 [phone]

## 2015-01-24 NOTE — ED Notes (Signed)
Patient reports abdominal pain since 7pm.  States attempting to "burp" but unable to.

## 2015-01-24 NOTE — ED Notes (Signed)
Pt. Finished contrast, called CT.

## 2015-01-25 DIAGNOSIS — K5669 Other intestinal obstruction: Secondary | ICD-10-CM

## 2015-01-25 DIAGNOSIS — K50912 Crohn's disease, unspecified, with intestinal obstruction: Secondary | ICD-10-CM

## 2015-01-25 LAB — C-REACTIVE PROTEIN: CRP: 4.5 mg/dL — AB (ref <1.0)

## 2015-01-25 LAB — COMPREHENSIVE METABOLIC PANEL
ALBUMIN: 3.6 g/dL (ref 3.5–5.0)
ALT: 15 U/L — ABNORMAL LOW (ref 17–63)
ANION GAP: 8 (ref 5–15)
AST: 15 U/L (ref 15–41)
Alkaline Phosphatase: 83 U/L (ref 38–126)
BUN: 14 mg/dL (ref 6–20)
CHLORIDE: 103 mmol/L (ref 101–111)
CO2: 27 mmol/L (ref 22–32)
Calcium: 9.3 mg/dL (ref 8.9–10.3)
Creatinine, Ser: 1.17 mg/dL (ref 0.61–1.24)
GFR calc Af Amer: >60 mL/min (ref >60)
GFR calc non Af Amer: >60 mL/min (ref >60)
GLUCOSE: 130 mg/dL — AB (ref 65–99)
POTASSIUM: 4.2 mmol/L (ref 3.5–5.1)
SODIUM: 138 mmol/L (ref 135–145)
Total Bilirubin: 0.9 mg/dL (ref 0.3–1.2)
Total Protein: 6.9 g/dL (ref 6.5–8.1)

## 2015-01-25 LAB — CBC
HEMATOCRIT: 38.1 % — AB (ref 40.0–52.0)
Hemoglobin: 12.7 g/dL — ABNORMAL LOW (ref 13.0–18.0)
MCH: 26.8 pg (ref 26.0–34.0)
MCHC: 33.4 g/dL (ref 32.0–36.0)
MCV: 80.4 fL (ref 80.0–100.0)
Platelets: 279 10*3/uL (ref 150–440)
RBC: 4.74 MIL/uL (ref 4.40–5.90)
RDW: 15 % — ABNORMAL HIGH (ref 11.5–14.5)
WBC: 9.7 10*3/uL (ref 3.8–10.6)

## 2015-01-25 LAB — PHOSPHORUS: Phosphorus: 3.3 mg/dL (ref 2.5–4.6)

## 2015-01-25 LAB — SEDIMENTATION RATE: Sed Rate: 15 mm/hr (ref 0–16)

## 2015-01-25 LAB — MAGNESIUM: Magnesium: 1.8 mg/dL (ref 1.7–2.4)

## 2015-01-25 MED ORDER — ACETAMINOPHEN 325 MG PO TABS
650.0000 mg | ORAL_TABLET | Freq: Four times a day (QID) | ORAL | Status: DC | PRN
  Administered 2015-01-25 – 2015-01-26 (×2): 650 mg via ORAL
  Filled 2015-01-25 (×2): qty 2

## 2015-01-25 MED ORDER — IBUPROFEN 600 MG PO TABS
600.0000 mg | ORAL_TABLET | Freq: Four times a day (QID) | ORAL | Status: DC | PRN
  Administered 2015-01-25: 600 mg via ORAL
  Filled 2015-01-25: qty 1

## 2015-01-25 NOTE — Progress Notes (Signed)
Spencer Heath is a 49 y.o. male patient with history of Crohn's disease presents with a SBO.  Patient states feels much better with NG decompression.  States that he began to pass some flatus this AM.  He still has intermittent cramping pain in the lower abdomen but states some improvement.   1. Abdominal pain, unspecified abdominal location   2. Small bowel obstruction     Past Medical History  Diagnosis Date  . Hypertension   . Crohn's disease     Current Facility-Administered Medications  Medication Dose Route Frequency Provider Last Rate Last Dose  . diphenhydrAMINE (BENADRYL) capsule 25 mg  25 mg Oral Q6H PRN Clayburn Pert, MD       Or  . diphenhydrAMINE (BENADRYL) injection 25 mg  25 mg Intravenous Q6H PRN Clayburn Pert, MD      . enoxaparin (LOVENOX) injection 40 mg  40 mg Subcutaneous Q24H Clayburn Pert, MD   40 mg at 01/25/15 0615  . hydrALAZINE (APRESOLINE) injection 10 mg  10 mg Intravenous Q2H PRN Clayburn Pert, MD      . lactated ringers infusion   Intravenous Continuous Clayburn Pert, MD 150 mL/hr at 01/25/15 [phone]    . methylPREDNISolone sodium succinate (SOLU-MEDROL) 40 mg/mL injection 20 mg  20 mg Intravenous 3 times per day Josefine Class, MD   20 mg at 01/25/15 [phone]  . morphine 4 MG/ML injection 4 mg  4 mg Intravenous Q4H PRN Clayburn Pert, MD   4 mg at 01/25/15 0741  . ondansetron (ZOFRAN-ODT) disintegrating tablet 4 mg  4 mg Oral Q6H PRN Clayburn Pert, MD       Or  . ondansetron [facility] injection 4 mg  4 mg Intravenous Q6H PRN Clayburn Pert, MD   4 mg at 01/24/15 1510  . pantoprazole (PROTONIX) injection 40 mg  40 mg Intravenous QHS Clayburn Pert, MD   40 mg at 01/24/15 2255   Allergies  Allergen Reactions  . Ivp Dye [Iodinated Diagnostic Agents] Hives   Principal Problem:   Small bowel obstruction Active Problems:   Crohn's disease  Blood pressure 158/77, pulse 67, temperature 98.7 F (37.1 C), temperature source Oral, resp. rate  18, height 5' 5"  (1.651 m), weight 220 lb (99.791 kg), SpO2 91 %.  I/O last 3 completed shifts: In: 2257 [I.V.:2167; NG/GT:90] Out: 1200 [Urine:650; Emesis/NG output:550] Total I/O In: 149 [I.V.:119; NG/GT:30] Out: 0     Review of Systems  Constitutional: Negative for fever and chills.  Gastrointestinal: Positive for abdominal pain. Negative for nausea, vomiting and blood in stool.  Genitourinary: Negative for dysuria and urgency.  Neurological: Negative for weakness.  All other systems reviewed and are negative.   Physical Exam  Constitutional: He is oriented to person, place, and time. He appears well-developed and well-nourished. No distress.  HENT:  Head: Normocephalic and atraumatic.  Mouth/Throat: No oropharyngeal exudate.  NG tube in left nare  Cardiovascular: Normal rate, regular rhythm, normal heart sounds and intact distal pulses.   Pulmonary/Chest: Effort normal and breath sounds normal. No respiratory distress.  Abdominal: Soft.  Moderate distension, mild tenderness suprapubic area, well healed midline scar  Musculoskeletal: Normal range of motion. He exhibits no edema.  Neurological: He is alert and oriented to person, place, and time.  Skin: Skin is warm and dry.  Psychiatric: He has a normal mood and affect. His behavior is normal. Thought content normal.  Vitals reviewed.  Labs CBC Latest Ref Rng 01/25/2015 01/24/2015 12/10/2014  WBC 3.8 - 10.6  K/uL 9.7 9.9 6.2  Hemoglobin 13.0 - 18.0 g/dL 12.7(L) 12.6(L) 13.1  Hematocrit 40.0 - 52.0 % 38.1(L) 37.7(L) 39.9(L)  Platelets 150 - 440 K/uL 279 275 295    BMP Latest Ref Rng 01/25/2015 01/24/2015 12/10/2014  Glucose 65 - 99 mg/dL 130(H) 122(H) 107(H)  BUN 6 - 20 mg/dL 14 16 18   Creatinine 0.61 - 1.24 mg/dL 1.17 1.25(H) 1.17  Sodium 135 - 145 mmol/L 138 140 139  Potassium 3.5 - 5.1 mmol/L 4.2 3.0(L) 3.3(L)  Chloride 101 - 111 mmol/L 103 103 104  CO2 22 - 32 mmol/L 27 29 27   Calcium 8.9 - 10.3 mg/dL 9.3 9.5 9.4    Assessment/Plan: 49 yr old male with Crohn's flair and sbo No acute need for surgical intervention at this time.  With the NG tube with ~700cc yellow output over last 24hrs, but beginning to pass flatus and decrease pain today hopeful that will improve with IV steroid.  Continue NPO, IV fluid hydration and NG tube decompression. Encouraged ambulation and can ambulate in hallways.    [place] Samanda Buske 01/25/2015

## 2015-01-25 NOTE — Care Management Important Message (Signed)
Important Message  Patient Details  Name: Spencer Heath MRN: [id number] Date of Birth: 1966-02-11   Medicare Important Message Given:  Yes-second notification given    Beau Fanny, RN 01/25/2015, 10:49 AM

## 2015-01-25 NOTE — Progress Notes (Signed)
   PROGRESS NOTE  Spencer Heath:[id number] DOB: 07-30-1965 DOA: 01/24/2015 PCP: Tracie Harrier, MD  Subjective  49 y/o male with hx of Crohn's disease, HTN admitted with Small bowel obstruction This am feels better and states he passes some gas . Pain has eased up to some xtent Consultants: Dr. Marina Gravel- Surgeon   Objective: BP 158/77 mmHg  Pulse 67  Temp(Src) 98.7 F (37.1 C) (Oral)  Resp 18  Ht 5' 5"  (1.651 m)  Wt 99.791 kg (220 lb)  BMI 36.61 kg/m2  SpO2 91%  Intake/Output Summary (Last 24 hours) at 01/25/15 1241 Last data filed at 01/25/15 1040  Gross per 24 hour  Intake   2729 ml  Output   1600 ml  Net   1129 ml   Filed Weights   01/24/15 0212  Weight: 99.791 kg (220 lb)    Exam:  General: Well developed, well nourished male, in NAD HEENT: PERRL; OP moist without lesions. Neck: supple, trachea midline, no thyromegaly Chest: normal to palpation Lungs: clear bilaterally without retractions or wheezes Cardiovascular: RRR, no murmur, Abdomen: soft, Distended. Mild tenderness suprapubic area BS: Hypoactive   Extremities: no clubbing, cyanosis, edema Neuro: alert and oriented, moves all extremities Derm: no significant rashes or nodules; good skin turgor Lymph: no cervical or supraclavicular lymphadenopathy   Labs and imaging studies were reviewed*  Data Reviewed: Basic Metabolic Panel:  Recent Labs Lab 01/24/15 0230 01/25/15 0457  NA 140 138  K 3.0* 4.2  CL 103 103  CO2 29 27  GLUCOSE 122* 130*  BUN 16 14  CREATININE 1.25* 1.17  CALCIUM 9.5 9.3  MG  --  1.8  PHOS  --  3.3   Liver Function Tests:  Recent Labs Lab 01/24/15 0230 01/25/15 0457  AST 19 15  ALT 18 15*  ALKPHOS 92 83  BILITOT 0.9 0.9  PROT 7.3 6.9  ALBUMIN 4.2 3.6    Recent Labs Lab 01/24/15 0230  LIPASE 17*   No results for input(s): AMMONIA in the last 168 hours. CBC:  Recent Labs Lab 01/24/15 0230 01/25/15 0457  WBC 9.9 9.7  HGB 12.6* 12.7*  HCT  37.7* 38.1*  MCV 80.2 80.4  PLT 275 279   Cardiac Enzymes:   No results for input(s): CKTOTAL, CKMB, CKMBINDEX, TROPONINI in the last 168 hours. BNP (last 3 results) No results for input(s): BNP in the last 8760 hours.  ProBNP (last 3 results) No results for input(s): PROBNP in the last 8760 hours.  CBG: No results for input(s): GLUCAP in the last 168 hours.  No results found for this or any previous visit (from the past 240 hour(s)).   Studies: No results found.  Scheduled Meds: . enoxaparin (LOVENOX) injection  40 mg Subcutaneous Q24H  . methylPREDNISolone (SOLU-MEDROL) injection  20 mg Intravenous 3 times per day  . pantoprazole (PROTONIX) IV  40 mg Intravenous QHS    Continuous Infusions: . lactated ringers 150 mL/hr at 01/25/15 7510    Assessment/Plan:  1  Crohn's disease with Small bowel obstruction; Appears to be  Slowly resolving. Seen by surgery- Continue NG tube. Currently NPO Continue IV Solumedrol Encouraged ambulation. 2 HTN; Continue to monitor- on Hydralazine prn 3 Hypokalemia; Resolved     Code Status: Full          01/25/2015, 12:41 PM  LOS: 1 day

## 2015-01-25 NOTE — Progress Notes (Signed)
GI Inpatient Follow-up Note  Patient Identification: Spencer Heath is a 49 y.o. male with Crohn's, a/w SBO  Subjective:  Feels better today. ABd pain decreased. Passing some gas. No f/c, NG still in place. Asking about going home.   Scheduled Inpatient Medications:  . enoxaparin (LOVENOX) injection  40 mg Subcutaneous Q24H  . methylPREDNISolone (SOLU-MEDROL) injection  20 mg Intravenous 3 times per day  . pantoprazole (PROTONIX) IV  40 mg Intravenous QHS    Continuous Inpatient Infusions:   . lactated ringers 150 mL/hr at 01/25/15 2041    PRN Inpatient Medications:  acetaminophen, diphenhydrAMINE **OR** diphenhydrAMINE, hydrALAZINE, ibuprofen, morphine injection, ondansetron **OR** ondansetron (ZOFRAN) IV    Physical Examination: BP 136/88 mmHg  Pulse 64  Temp(Src) 98.6 F (37 C) (Oral)  Resp 18  Ht 5' 5"  (1.651 m)  Wt 99.791 kg (220 lb)  BMI 36.61 kg/m2  SpO2 97% Gen: NAD, alert and oriented x 4  Neck: supple, no JVD or thyromegaly Chest: CTA bilaterally, no wheezes, crackles, or other adventitious sounds CV: RRR, no m/g/c/r Abd: +distended but less than yesterday, decreased bowel sounds, no r/g. +midline scar Ext: no edema, well perfused with 2+ pulses, Skin: no rash or lesions noted Lymph: no LAD  Data: Lab Results  Component Value Date   WBC 9.7 01/25/2015   HGB 12.7* 01/25/2015   HCT 38.1* 01/25/2015   MCV 80.4 01/25/2015   PLT 279 01/25/2015    Recent Labs Lab 01/24/15 0230 01/25/15 0457  HGB 12.6* 12.7*   Lab Results  Component Value Date   NA 138 01/25/2015   K 4.2 01/25/2015   CL 103 01/25/2015   CO2 27 01/25/2015   BUN 14 01/25/2015   CREATININE 1.17 01/25/2015   Lab Results  Component Value Date   ALT 15* 01/25/2015   AST 15 01/25/2015   ALKPHOS 83 01/25/2015   BILITOT 0.9 01/25/2015   No results for input(s): APTT, INR, PTT in the last 168 hours.   Assessment/Plan: Spencer Heath is a 49 y.o. male with Crohn's on  entercort, a/w SBO. Improved clincally today but NG still to suction.   Recommendations: - cont solumedrol 20 mg q8 hr - hopefully clamp or d/c NG tube in am.  - consider CTE or MRE to eval for evidence of small bowel Crohn's disease.   Please call with questions or concerns.  REIN, Grace Blight, MD

## 2015-01-26 LAB — CBC
HEMATOCRIT: 35.9 % — AB (ref 40.0–52.0)
Hemoglobin: 11.7 g/dL — ABNORMAL LOW (ref 13.0–18.0)
MCH: 26.3 pg (ref 26.0–34.0)
MCHC: 32.6 g/dL (ref 32.0–36.0)
MCV: 80.7 fL (ref 80.0–100.0)
PLATELETS: 269 10*3/uL (ref 150–440)
RBC: 4.45 MIL/uL (ref 4.40–5.90)
RDW: 15 % — ABNORMAL HIGH (ref 11.5–14.5)
WBC: 13.8 10*3/uL — AB (ref 3.8–10.6)

## 2015-01-26 LAB — COMPREHENSIVE METABOLIC PANEL
ALBUMIN: 3.4 g/dL — AB (ref 3.5–5.0)
ALT: 14 U/L — ABNORMAL LOW (ref 17–63)
ANION GAP: 5 (ref 5–15)
AST: 18 U/L (ref 15–41)
Alkaline Phosphatase: 77 U/L (ref 38–126)
BUN: 19 mg/dL (ref 6–20)
CO2: 30 mmol/L (ref 22–32)
Calcium: 9.1 mg/dL (ref 8.9–10.3)
Chloride: 105 mmol/L (ref 101–111)
Creatinine, Ser: 1.08 mg/dL (ref 0.61–1.24)
GFR calc Af Amer: >60 mL/min (ref >60)
GFR calc non Af Amer: >60 mL/min (ref >60)
GLUCOSE: 119 mg/dL — AB (ref 65–99)
POTASSIUM: 4.2 mmol/L (ref 3.5–5.1)
SODIUM: 140 mmol/L (ref 135–145)
Total Bilirubin: 0.8 mg/dL (ref 0.3–1.2)
Total Protein: 6.4 g/dL — ABNORMAL LOW (ref 6.5–8.1)

## 2015-01-26 LAB — PHOSPHORUS: Phosphorus: 3 mg/dL (ref 2.5–4.6)

## 2015-01-26 LAB — MAGNESIUM: Magnesium: 2.2 mg/dL (ref 1.7–2.4)

## 2015-01-26 MED ORDER — SUMATRIPTAN SUCCINATE 6 MG/0.5ML ~~LOC~~ SOLN
6.0000 mg | Freq: Once | SUBCUTANEOUS | Status: AC
  Administered 2015-01-26: 6 mg via SUBCUTANEOUS
  Filled 2015-01-26: qty 0.5

## 2015-01-26 MED ORDER — PREDNISONE 20 MG PO TABS: 30.0000 mg | ORAL_TABLET | Freq: Every day | ORAL | Status: DC

## 2015-01-26 NOTE — Progress Notes (Signed)
PROGRESS NOTE  Spencer Heath LTJ:[id number] DOB: 1965-05-23 DOA: 01/24/2015 PCP: Tracie Harrier, MD  Subjective  49 y/o male with hx of Crohn's disease, HTN admitted with Small bowel obstruction This am feels better and states he had a good bowel movement . Still having headaches Consultants: Dr. Marina Gravel- Surgeon   Objective: BP 138/78 mmHg  Pulse 59  Temp(Src) 97.8 F (36.6 C) (Oral)  Resp 16  Ht 5' 5"  (1.651 m)  Wt 99.791 kg (220 lb)  BMI 36.61 kg/m2  SpO2 97%  Intake/Output Summary (Last 24 hours) at 01/26/15 0826 Last data filed at 01/26/15 0549  Gross per 24 hour  Intake   2107 ml  Output   1100 ml  Net   1007 ml   Filed Weights   01/24/15 0212  Weight: 99.791 kg (220 lb)    Exam:  General: Well developed, well nourished male, in NAD HEENT: PERRL; OP moist without lesions. Neck: supple, trachea midline, no thyromegaly Chest: normal to palpation Lungs: clear bilaterally without retractions or wheezes Cardiovascular: RRR, no murmur, Abdomen: soft, Distension has improved. Non tender BS: Hypoactive   Extremities: no clubbing, cyanosis, edema Neuro: alert and oriented, moves all extremities Derm: no significant rashes or nodules; good skin turgor Lymph: no cervical or supraclavicular lymphadenopathy   Labs and imaging studies were reviewed*  Data Reviewed: Basic Metabolic Panel:  Recent Labs Lab 01/24/15 0230 01/25/15 0457 01/26/15 0437  NA 140 138 140  K 3.0* 4.2 4.2  CL 103 103 105  CO2 29 27 30   GLUCOSE 122* 130* 119*  BUN 16 14 19   CREATININE 1.25* 1.17 1.08  CALCIUM 9.5 9.3 9.1  MG  --  1.8 2.2  PHOS  --  3.3 3.0   Liver Function Tests:  Recent Labs Lab 01/24/15 0230 01/25/15 0457 01/26/15 0437  AST 19 15 18   ALT 18 15* 14*  ALKPHOS 92 83 77  BILITOT 0.9 0.9 0.8  PROT 7.3 6.9 6.4*  ALBUMIN 4.2 3.6 3.4*    Recent Labs Lab 01/24/15 0230  LIPASE 17*   No results for input(s): AMMONIA in the last 168  hours. CBC:  Recent Labs Lab 01/24/15 0230 01/25/15 0457 01/26/15 0437  WBC 9.9 9.7 13.8*  HGB 12.6* 12.7* 11.7*  HCT 37.7* 38.1* 35.9*  MCV 80.2 80.4 80.7  PLT 275 279 269   Cardiac Enzymes:   No results for input(s): CKTOTAL, CKMB, CKMBINDEX, TROPONINI in the last 168 hours. BNP (last 3 results) No results for input(s): BNP in the last 8760 hours.  ProBNP (last 3 results) No results for input(s): PROBNP in the last 8760 hours.  CBG: No results for input(s): GLUCAP in the last 168 hours.  No results found for this or any previous visit (from the past 240 hour(s)).   Studies: No results found.  Scheduled Meds: . enoxaparin (LOVENOX) injection  40 mg Subcutaneous Q24H  . methylPREDNISolone (SOLU-MEDROL) injection  20 mg Intravenous 3 times per day  . pantoprazole (PROTONIX) IV  40 mg Intravenous QHS    Continuous Infusions: . lactated ringers 150 mL/hr at 01/25/15 2041    Assessment/Plan:  1  Crohn's disease with Small bowel obstruction; Doing better. Bowels moved today. On  IV Solumedrol Encouraged ambulation. 2 HTN; Better controlled- Continue to monitor- on Hydralazine prn 3 Hypokalemia; Resolved  For possible discharge later today    Code Status: Full          01/26/2015, 8:26 AM  LOS: 2 days

## 2015-01-26 NOTE — Progress Notes (Signed)
GI Inpatient Follow-up Note  Patient Identification: Spencer Heath is a 49 y.o. male with Crohn's , SBO  Subjective:  Much improved today. NG out and doing well. Moved bowels. Passing gas. NO abd pain.  Tolerating clears.   Scheduled Inpatient Medications:  . enoxaparin (LOVENOX) injection  40 mg Subcutaneous Q24H  . methylPREDNISolone (SOLU-MEDROL) injection  20 mg Intravenous 3 times per day  . pantoprazole (PROTONIX) IV  40 mg Intravenous QHS    Continuous Inpatient Infusions:   . lactated ringers 150 mL/hr at 01/26/15 1739    PRN Inpatient Medications:  acetaminophen, diphenhydrAMINE **OR** diphenhydrAMINE, hydrALAZINE, ibuprofen, morphine injection, ondansetron **OR** ondansetron (ZOFRAN) IV     Physical Examination: BP 160/79 mmHg  Pulse 52  Temp(Src) 97.6 F (36.4 C) (Oral)  Resp 20  Ht 5' 5"  (1.651 m)  Wt 99.791 kg (220 lb)  BMI 36.61 kg/m2  SpO2 97% Gen: NAD, alert and oriented x 4 HEENT: PEERLA, EOMI, Neck: supple, no JVD or thyromegaly Chest: CTA bilaterally, no wheezes, crackles, or other adventitious sounds CV: RRR, no m/g/c/r Abd: soft, NT, mil distension, +BS in all four quadrants; no HSM, guarding, ridigity, or rebound tenderness, + midline scar Ext: no edema, well perfused with 2+ pulses, Skin: no rash or lesions noted Lymph: no LAD  Data: Lab Results  Component Value Date   WBC 13.8* 01/26/2015   HGB 11.7* 01/26/2015   HCT 35.9* 01/26/2015   MCV 80.7 01/26/2015   PLT 269 01/26/2015    Recent Labs Lab 01/24/15 0230 01/25/15 0457 01/26/15 0437  HGB 12.6* 12.7* 11.7*   Lab Results  Component Value Date   NA 140 01/26/2015   K 4.2 01/26/2015   CL 105 01/26/2015   CO2 30 01/26/2015   BUN 19 01/26/2015   CREATININE 1.08 01/26/2015   Lab Results  Component Value Date   ALT 14* 01/26/2015   AST 18 01/26/2015   ALKPHOS 77 01/26/2015   BILITOT 0.8 01/26/2015   No results for input(s): APTT, INR, PTT in the last 168 hours.    Assessment/Plan: Spencer Heath is a 49 y.o. male witih Crohn's, a/w SBO. Improving clinically, doing well with NG tube removed   Recommendations: - change pred to 30 mg daily - rec taper 30 for 7 days, 20 for 7 days, 10 for 7 days, then off - MRE or CTE to eval for active Crohn's   Please call with questions or concerns.  REIN, Grace Blight, MD

## 2015-01-26 NOTE — Progress Notes (Signed)
Spencer Heath is a 49 y.o. male with Crohn's disease flair and SBO.  Patient doing much better yesterday, decreased NG tube output, passing flatus and having BMs.  Denies any nausea, vomiting or further abdominal pain.   Filed Vitals:   01/26/15 0715  BP:   Pulse:   Temp:   Resp: 16   General appearance: alert, cooperative and no distress Abdomen: soft, non distended, non tender, well healed midline scar Extremities: 1+ pitting edema, 2+ pulses   CBC Latest Ref Rng 01/26/2015 01/25/2015 01/24/2015  WBC 3.8 - 10.6 K/uL 13.8(H) 9.7 9.9  Hemoglobin 13.0 - 18.0 g/dL 11.7(L) 12.7(L) 12.6(L)  Hematocrit 40.0 - 52.0 % 35.9(L) 38.1(L) 37.7(L)  Platelets 150 - 440 K/uL 269 279 275   BMP Latest Ref Rng 01/26/2015 01/25/2015 01/24/2015  Glucose 65 - 99 mg/dL 119(H) 130(H) 122(H)  BUN 6 - 20 mg/dL 19 14 16   Creatinine 0.61 - 1.24 mg/dL 1.08 1.17 1.25(H)  Sodium 135 - 145 mmol/L 140 138 140  Potassium 3.5 - 5.1 mmol/L 4.2 4.2 3.0(L)  Chloride 101 - 111 mmol/L 105 103 103  CO2 22 - 32 mmol/L 30 27 29   Calcium 8.9 - 10.3 mg/dL 9.1 9.3 9.5    Assessment/Plan:  49 y.o. male with Crohn's disease flair and SBO Improved overnight, NG tube d/c this Am, may begin clear liquids, continue steroids, no acute need for surgery

## 2015-01-26 NOTE — Progress Notes (Signed)
Called Dr Azalee Course d/t pt c/o headache and request for medication; Dr acknowledged, stated would enter orders

## 2015-01-27 LAB — COMPREHENSIVE METABOLIC PANEL
ALBUMIN: 3.5 g/dL (ref 3.5–5.0)
ALT: 14 U/L — AB (ref 17–63)
AST: 20 U/L (ref 15–41)
Alkaline Phosphatase: 67 U/L (ref 38–126)
Anion gap: 5 (ref 5–15)
BILIRUBIN TOTAL: 0.4 mg/dL (ref 0.3–1.2)
BUN: 17 mg/dL (ref 6–20)
CHLORIDE: 108 mmol/L (ref 101–111)
CO2: 30 mmol/L (ref 22–32)
CREATININE: 1.06 mg/dL (ref 0.61–1.24)
Calcium: 9 mg/dL (ref 8.9–10.3)
GFR calc Af Amer: >60 mL/min (ref >60)
GLUCOSE: 90 mg/dL (ref 65–99)
Potassium: 3.7 mmol/L (ref 3.5–5.1)
Sodium: 143 mmol/L (ref 135–145)
Total Protein: 6.2 g/dL — ABNORMAL LOW (ref 6.5–8.1)

## 2015-01-27 LAB — CBC
HCT: 35 % — ABNORMAL LOW (ref 40.0–52.0)
HEMOGLOBIN: 11.6 g/dL — AB (ref 13.0–18.0)
MCH: 26.7 pg (ref 26.0–34.0)
MCHC: 33 g/dL (ref 32.0–36.0)
MCV: 80.8 fL (ref 80.0–100.0)
Platelets: 288 10*3/uL (ref 150–440)
RBC: 4.33 MIL/uL — ABNORMAL LOW (ref 4.40–5.90)
RDW: 15.3 % — ABNORMAL HIGH (ref 11.5–14.5)
WBC: 13.3 10*3/uL — ABNORMAL HIGH (ref 3.8–10.6)

## 2015-01-27 LAB — MAGNESIUM: MAGNESIUM: 2.2 mg/dL (ref 1.7–2.4)

## 2015-01-27 LAB — PHOSPHORUS: Phosphorus: 2 mg/dL — ABNORMAL LOW (ref 2.5–4.6)

## 2015-01-27 MED ORDER — PREDNISONE 10 MG PO TABS: 30.0000 mg | ORAL_TABLET | Freq: Every day | ORAL | Status: AC

## 2015-01-27 MED ORDER — SUMATRIPTAN SUCCINATE 50 MG PO TABS
25.0000 mg | ORAL_TABLET | ORAL | Status: DC | PRN
  Administered 2015-01-27: 25 mg via ORAL
  Filled 2015-01-27 (×2): qty 1

## 2015-01-27 NOTE — Progress Notes (Signed)
PROGRESS NOTE  Spencer Heath ZOX:[id number] DOB: May 20, 1965 DOA: 01/24/2015 PCP: Tracie Harrier, MD  Subjective  49 y/o male with hx of Crohn's disease, HTN admitted with Small bowel obstruction This am feels better. Tolerating clear liquid diet . BP - better controlled. Still having headache    Consultants: Dr. Marina Gravel- Surgeon   Objective: BP 136/86 mmHg  Pulse 56  Temp(Src) 98.2 F (36.8 C) (Oral)  Resp 20  Ht 5' 5"  (1.651 m)  Wt 99.791 kg (220 lb)  BMI 36.61 kg/m2  SpO2 97%  Intake/Output Summary (Last 24 hours) at 01/27/15 0820 Last data filed at 01/27/15 0530  Gross per 24 hour  Intake   4782 ml  Output      0 ml  Net   4782 ml   Filed Weights   01/24/15 0212  Weight: 99.791 kg (220 lb)    Exam:  General: Well developed, well nourished male, in NAD HEENT: PERRL; OP moist without lesions. Neck: supple, trachea midline, no thyromegaly Chest: normal to palpation Lungs: clear bilaterally without retractions or wheezes Cardiovascular: RRR, no murmur, Abdomen: soft, Distension has improved. Non tender BS+  Extremities: no clubbing, cyanosis, edema Neuro: alert and oriented, moves all extremities Derm: no significant rashes or nodules; good skin turgor Lymph: no cervical or supraclavicular lymphadenopathy   Labs and imaging studies were reviewed*  Data Reviewed: Basic Metabolic Panel:  Recent Labs Lab 01/24/15 0230 01/25/15 0457 01/26/15 0437 01/27/15 0606  NA 140 138 140 143  K 3.0* 4.2 4.2 3.7  CL 103 103 105 108  CO2 29 27 30 30   GLUCOSE 122* 130* 119* 90  BUN 16 14 19 17   CREATININE 1.25* 1.17 1.08 1.06  CALCIUM 9.5 9.3 9.1 9.0  MG  --  1.8 2.2 2.2  PHOS  --  3.3 3.0 2.0*   Liver Function Tests:  Recent Labs Lab 01/24/15 0230 01/25/15 0457 01/26/15 0437 01/27/15 0606  AST 19 15 18 20   ALT 18 15* 14* 14*  ALKPHOS 92 83 77 67  BILITOT 0.9 0.9 0.8 0.4  PROT 7.3 6.9 6.4* 6.2*  ALBUMIN 4.2 3.6 3.4* 3.5    Recent  Labs Lab 01/24/15 0230  LIPASE 17*   No results for input(s): AMMONIA in the last 168 hours. CBC:  Recent Labs Lab 01/24/15 0230 01/25/15 0457 01/26/15 0437 01/27/15 0606  WBC 9.9 9.7 13.8* 13.3*  HGB 12.6* 12.7* 11.7* 11.6*  HCT 37.7* 38.1* 35.9* 35.0*  MCV 80.2 80.4 80.7 80.8  PLT 275 279 269 288   Cardiac Enzymes:   No results for input(s): CKTOTAL, CKMB, CKMBINDEX, TROPONINI in the last 168 hours. BNP (last 3 results) No results for input(s): BNP in the last 8760 hours.  ProBNP (last 3 results) No results for input(s): PROBNP in the last 8760 hours.  CBG: No results for input(s): GLUCAP in the last 168 hours.  No results found for this or any previous visit (from the past 240 hour(s)).   Studies: No results found.  Scheduled Meds: . enoxaparin (LOVENOX) injection  40 mg Subcutaneous Q24H  . pantoprazole (PROTONIX) IV  40 mg Intravenous QHS  . [START ON 01/29/2015] predniSONE  30 mg Oral QAC breakfast    Continuous Infusions: . lactated ringers 150 mL/hr at 01/27/15 0215    Assessment/Plan:  1  Crohn's disease with Small bowel obstruction; Doing better. Bowels moving well On  Prednisone- Taper slowly Encouraged ambulation. 2 HTN; Better controlled- Continue to monitor- on Hydralazine prn 3 Hypokalemia; Resolved  Most likely discharge later today as per surgical team    Code Status: Full          01/27/2015, 8:20 AM  LOS: 3 days

## 2015-01-27 NOTE — Discharge Summary (Signed)
Physician Discharge Summary  Patient ID: Spencer Heath MRN: [id number] DOB/AGE: 48-Jul-1967 48 y.o.  Admit date: 01/24/2015 Discharge date: 01/27/2015  Admission Diagnoses: Small bowel obstruction  Discharge Diagnoses:  Principal Problem:   Small bowel obstruction Active Problems:   Crohn's disease   Discharged Condition: good  Hospital Course:  49 yr old with Crohn's disease and flair resulting in a partial small bowel obstruction.  He resolved after having an ng tube and then began having flatus and BMs.  He is now tolerating a regular diet without any issues  Consults: GI and Internal medicine  Significant Diagnostic Studies: radiology: X-Ray: SBO  Treatments: IV hydration and steroids: prednisone  Discharge Exam: Blood pressure 136/86, pulse 56, temperature 98.2 F (36.8 C), temperature source Oral, resp. rate 20, height 5' 5"  (1.651 m), weight 220 lb (99.791 kg), SpO2 97 %. General appearance: alert, cooperative and no distress Chest wall: no tenderness, Clear to ausculation bilaterally Cardio: regular rate and rhythm and no mumur, rub or gallop GI: soft, nt, nd, well healed scars Extremities: 2+ pulses, no edema, atraumatic Pulses: 2+ and symmetric Neurologic: Mental status: Alert, oriented, thought content appropriate  Disposition: 01-Home or Self Care  Discharge Instructions    Diet - low sodium heart healthy    Complete by:  As directed      Discharge instructions    Complete by:  As directed   Take steroid as prescribed, 3 tabs once a day for 7 days, then 2 tabs once daily for another 7 days, then 1 tab once daily for 7 days     Increase activity slowly    Complete by:  As directed             Medication List    TAKE these medications        amitriptyline 10 MG tablet  Commonly known as:  ELAVIL  Take 10 mg by mouth Nightly.     amLODipine 5 MG tablet  Commonly known as:  NORVASC  Take 5 mg by mouth daily at 6 (six) AM.     budesonide 3 MG  24 hr capsule  Commonly known as:  ENTOCORT EC  Take 6 mg by mouth daily at 6 (six) AM.     Capsaicin 0.1 % Crea  Apply to needed area twice daily.     etodolac 400 MG tablet  Commonly known as:  LODINE  Take 1 tablet (400 mg total) by mouth 2 (two) times daily.     hydrochlorothiazide 25 MG tablet  Commonly known as:  HYDRODIURIL  Take 25 mg by mouth daily at 6 (six) AM.     lisinopril 40 MG tablet  Commonly known as:  PRINIVIL,ZESTRIL  Take 40 mg by mouth daily at 6 (six) AM.     pantoprazole 40 MG tablet  Commonly known as:  PROTONIX  Take 40 mg by mouth daily at 6 (six) AM.     predniSONE 10 MG tablet  Commonly known as:  DELTASONE  Take 3 tablets (30 mg total) by mouth daily before breakfast.  Start taking on:  01/29/2015           Follow-up Information    Follow up with Josefine Class, MD. Go on 02/11/2015.   Specialty:  Gastroenterology   Why:  Go on 02/11/15 at 9:30am you will see [name] PA she is with Dr. Rayann Heman office.   Contact information:   [place] [address] [phone]  Follow up with Tracie Harrier, MD In 2 weeks.   Specialty:  Internal Medicine   Why:  hypertension managment   Contact information:   [place] [address] [phone]       Signed: Hubbard Robinson 01/27/2015, 4:21 PM

## 2015-01-27 NOTE — Progress Notes (Signed)
Pt was alert and oriented and comfortable with pain medication. Pt ambulated well and progressed to regular diet from clear liquid. Pt was given discharge instructions with education on follow up appointments and medication schedule. Patient stated understanding. Pt wheeled to hospital exit via nurse tech.

## 2015-01-27 NOTE — Discharge Instructions (Signed)
Abdominal Pain Many things can cause abdominal pain. Usually, abdominal pain is not caused by a disease and will improve without treatment. It can often be observed and treated at home. Your health care provider will do a physical exam and possibly order blood tests and X-rays to help determine the seriousness of your pain. However, in many cases, more time must pass before a clear cause of the pain can be found. Before that point, your health care provider may not know if you need more testing or further treatment. HOME CARE INSTRUCTIONS  Monitor your abdominal pain for any changes. The following actions may help to alleviate any discomfort you are experiencing:  Only take over-the-counter or prescription medicines as directed by your health care provider.  Do not take laxatives unless directed to do so by your health care provider.  Try a clear liquid diet (broth, tea, or water) as directed by your health care provider. Slowly move to a bland diet as tolerated. SEEK MEDICAL CARE IF:  You have unexplained abdominal pain.  You have abdominal pain associated with nausea or diarrhea.  You have pain when you urinate or have a bowel movement.  You experience abdominal pain that wakes you in the night.  You have abdominal pain that is worsened or improved by eating food.  You have abdominal pain that is worsened with eating fatty foods.  You have a fever. SEEK IMMEDIATE MEDICAL CARE IF:   Your pain does not go away within 2 hours.  You keep throwing up (vomiting).  Your pain is felt only in portions of the abdomen, such as the right side or the left lower portion of the abdomen.  You pass bloody or black tarry stools. MAKE SURE YOU:  Understand these instructions.   Will watch your condition.   Will get help right away if you are not doing well or get worse.  Document Released: 02/08/2005 Document Revised: 05/06/2013 Document Reviewed: 01/08/2013 [facility] Patient Information  2015 [place], [place]. This information is not intended to replace advice given to you by your health care provider. Make sure you discuss any questions you have with your health care provider.  Follow all MD instructions. Take all medications as prescribed. Call physician if you have any questions.

## 2015-05-18 ENCOUNTER — Other Ambulatory Visit: Payer: Self-pay | Admitting: Internal Medicine

## 2015-05-18 DIAGNOSIS — R51 Headache: Principal | ICD-10-CM

## 2015-05-18 DIAGNOSIS — R519 Headache, unspecified: Secondary | ICD-10-CM

## 2015-06-07 ENCOUNTER — Ambulatory Visit: Payer: 59

## 2015-06-13 ENCOUNTER — Encounter: Payer: Self-pay | Admitting: Emergency Medicine

## 2015-06-13 ENCOUNTER — Emergency Department
Admission: EM | Admit: 2015-06-13 | Discharge: 2015-06-13 | Disposition: A | Discharge status: Home / Self Care | Payer: 59 | Attending: Emergency Medicine | Admitting: Emergency Medicine

## 2015-06-13 DIAGNOSIS — M791 Myalgia, unspecified site: Secondary | ICD-10-CM

## 2015-06-13 DIAGNOSIS — R05 Cough: Secondary | ICD-10-CM | POA: Diagnosis present

## 2015-06-13 DIAGNOSIS — Z79899 Other long term (current) drug therapy: Secondary | ICD-10-CM | POA: Diagnosis not present

## 2015-06-13 DIAGNOSIS — J029 Acute pharyngitis, unspecified: Secondary | ICD-10-CM | POA: Diagnosis not present

## 2015-06-13 DIAGNOSIS — I10 Essential (primary) hypertension: Secondary | ICD-10-CM | POA: Diagnosis not present

## 2015-06-13 DIAGNOSIS — J069 Acute upper respiratory infection, unspecified: Secondary | ICD-10-CM | POA: Insufficient documentation

## 2015-06-13 MED ORDER — AZITHROMYCIN 500 MG PO TABS
500.0000 mg | ORAL_TABLET | Freq: Once | ORAL | Status: AC
  Administered 2015-06-13: 500 mg via ORAL
  Filled 2015-06-13: qty 1

## 2015-06-13 MED ORDER — MAGIC MOUTHWASH
10.0000 mL | Freq: Once | ORAL | Status: AC
  Administered 2015-06-13: 10 mL via ORAL
  Filled 2015-06-13: qty 10

## 2015-06-13 MED ORDER — MAGIC MOUTHWASH: 5.0000 mL | Freq: Three times a day (TID) | ORAL | Status: DC | PRN

## 2015-06-13 MED ORDER — AZITHROMYCIN 250 MG PO TABS: 250.0000 mg | ORAL_TABLET | Freq: Every day | ORAL | Status: DC

## 2015-06-13 MED ORDER — IPRATROPIUM-ALBUTEROL 0.5-2.5 (3) MG/3ML IN SOLN
RESPIRATORY_TRACT | Status: AC
  Filled 2015-06-13: qty 3

## 2015-06-13 NOTE — ED Notes (Signed)
Patient ambulatory to treatment room without difficulty or distress noted.  Patient reports having symptoms since Friday - non productive cough, body aches and runny nose.  Reports feeling worse today and concerned might have the flu.  Lungs clear bilaterally, no distress noted. Heart rate regular, no ectopy noted.  No peripheral edema noted.

## 2015-06-13 NOTE — Discharge Instructions (Signed)
1. Take antibiotic as prescribed (azithromycin 250 mg daily 4 days). 2. Use Magic mouthwash as needed for throat pain. 3. Return to the ER for worsening symptoms, persistent vomiting, difficulty breathing or other concerns.  Pharyngitis Pharyngitis is a sore throat (pharynx). There is redness, pain, and swelling of your throat. HOME CARE   Drink enough fluids to keep your pee (urine) clear or pale yellow.  Only take medicine as told by your doctor.  You may get sick again if you do not take medicine as told. Finish your medicines, even if you start to feel better.  Do not take aspirin.  Rest.  Rinse your mouth (gargle) with salt water ( tsp of salt per 1 qt of water) every 1-2 hours. This will help the pain.  If you are not at risk for choking, you can suck on hard candy or sore throat lozenges. GET HELP IF:  You have large, tender lumps on your neck.  You have a rash.  You cough up green, yellow-brown, or bloody spit. GET HELP RIGHT AWAY IF:   You have a stiff neck.  You drool or cannot swallow liquids.  You throw up (vomit) or are not able to keep medicine or liquids down.  You have very bad pain that does not go away with medicine.  You have problems breathing (not from a stuffy nose). MAKE SURE YOU:   Understand these instructions.  Will watch your condition.  Will get help right away if you are not doing well or get worse.   This information is not intended to replace advice given to you by your health care provider. Make sure you discuss any questions you have with your health care provider.   Document Released: 10/18/2007 Document Revised: 02/19/2013 Document Reviewed: 01/06/2013 Elsevier Interactive Patient Education 2016 [employer]  Upper Respiratory Infection, Adult Most upper respiratory infections (URIs) are caused by a virus. A URI affects the nose, throat, and upper air passages. The most common type of URI is often called "the common  cold." HOME CARE   Take medicines only as told by your doctor.  Gargle warm saltwater or take cough drops to comfort your throat as told by your doctor.  Use a warm mist humidifier or inhale steam from a shower to increase air moisture. This may make it easier to breathe.  Drink enough fluid to keep your pee (urine) clear or pale yellow.  Eat soups and other clear broths.  Have a healthy diet.  Rest as needed.  Go back to work when your fever is gone or your doctor says it is okay.  You may need to stay home longer to avoid giving your URI to others.  You can also wear a face mask and wash your hands often to prevent spread of the virus.  Use your inhaler more if you have asthma.  Do not use any tobacco products, including cigarettes, chewing tobacco, or electronic cigarettes. If you need help quitting, ask your doctor. GET HELP IF:  You are getting worse, not better.  Your symptoms are not helped by medicine.  You have chills.  You are getting more short of breath.  You have brown or red mucus.  You have yellow or brown discharge from your nose.  You have pain in your face, especially when you bend forward.  You have a fever.  You have puffy (swollen) neck glands.  You have pain while swallowing.  You have white areas in the back of your  throat. GET HELP RIGHT AWAY IF:   You have very bad or constant:  Headache.  Ear pain.  Pain in your forehead, behind your eyes, and over your cheekbones (sinus pain).  Chest pain.  You have long-lasting (chronic) lung disease and any of the following:  Wheezing.  Long-lasting cough.  Coughing up blood.  A change in your usual mucus.  You have a stiff neck.  You have changes in your:  Vision.  Hearing.  Thinking.  Mood. MAKE SURE YOU:   Understand these instructions.  Will watch your condition.  Will get help right away if you are not doing well or get worse.   This information is not intended  to replace advice given to you by your health care provider. Make sure you discuss any questions you have with your health care provider.   Document Released: 10/18/2007 Document Revised: 09/15/2014 Document Reviewed: 08/06/2013 Elsevier Interactive Patient Education 2016 [employer]  Muscle Pain, Adult Muscle pain (myalgia) may be caused by many things, including:  Overuse or muscle strain, especially if you are not in shape. This is the most common cause of muscle pain.  Injury.  Bruises.  Viruses, such as the flu.  Infectious diseases.  Fibromyalgia, which is a chronic condition that causes muscle tenderness, fatigue, and headache.  Autoimmune diseases, including lupus.  Certain drugs, including ACE inhibitors and statins. Muscle pain may be mild or severe. In most cases, the pain lasts only a short time and goes away without treatment. To diagnose the cause of your muscle pain, your health care provider will take your medical history. This means he or she will ask you when your muscle pain began and what has been happening. If you have not had muscle pain for very long, your health care provider may want to wait before doing much testing. If your muscle pain has lasted a long time, your health care provider may want to run tests right away. If your health care provider thinks your muscle pain may be caused by illness, you may need to have additional tests to rule out certain conditions.  Treatment for muscle pain depends on the cause. Home care is often enough to relieve muscle pain. Your health care provider may also prescribe anti-inflammatory medicine. HOME CARE INSTRUCTIONS Watch your condition for any changes. The following actions may help to lessen any discomfort you are feeling:  Only take over-the-counter or prescription medicines as directed by your health care provider.  Apply ice to the sore muscle:  Put ice in a plastic bag.  Place a towel between your skin and  the bag.  Leave the ice on for 15-20 minutes, 3-4 times a day.  You may alternate applying hot and cold packs to the muscle as directed by your health care provider.  If overuse is causing your muscle pain, slow down your activities until the pain goes away.  Remember that it is normal to feel some muscle pain after starting a workout program. Muscles that have not been used often will be sore at first.  Do regular, gentle exercises if you are not usually active.  Warm up before exercising to lower your risk of muscle pain.  Do not continue working out if the pain is very bad. Bad pain could mean you have injured a muscle. SEEK MEDICAL CARE IF:  Your muscle pain gets worse, and medicines do not help.  You have muscle pain that lasts longer than 3 days.  You have a rash  or fever along with muscle pain.  You have muscle pain after a tick bite.  You have muscle pain while working out, even though you are in good physical condition.  You have redness, soreness, or swelling along with muscle pain.  You have muscle pain after starting a new medicine or changing the dose of a medicine. SEEK IMMEDIATE MEDICAL CARE IF:  You have trouble breathing.  You have trouble swallowing.  You have muscle pain along with a stiff neck, fever, and vomiting.  You have severe muscle weakness or cannot move part of your body. MAKE SURE YOU:   Understand these instructions.  Will watch your condition.  Will get help right away if you are not doing well or get worse.   This information is not intended to replace advice given to you by your health care provider. Make sure you discuss any questions you have with your health care provider.   Document Released: 03/23/2006 Document Revised: 05/22/2014 Document Reviewed: 02/25/2013 Elsevier Interactive Patient Education [employer].

## 2015-06-13 NOTE — ED Notes (Signed)
Resumed care from [place], [place].

## 2015-06-13 NOTE — ED Provider Notes (Signed)
[facility] Emergency Department Provider Note  ____________________________________________  Time seen: Approximately 6:52 AM  I have reviewed the triage vital signs and the nursing notes.   HISTORY  Chief Complaint Cough; Generalized Body Aches; and Nasal Congestion    HPI Spencer Heath is a 50 y.o. male who presents to the ED from home with cold type symptoms. Patient reports body aches, runny nose, nonproductive cough and sore throat 2 days. Subjective fever. Denies associated headache, neck pain, chest pain, shortness of breath, abdominal pain, nausea, vomiting, diarrhea. Denies recent travel or trauma. Nothing makes his symptoms better or worse.   Past Medical History  Diagnosis Date  . Hypertension   . Crohn's disease [facility])     Patient Active Problem List   Diagnosis Date Noted  . Small bowel obstruction ([place]) 01/24/2015  . Crohn's disease ([place]) 01/24/2015    Past Surgical History  Procedure Laterality Date  . Abdominal surgery    . Testicle surgery    . Appendectomy    . Testical removed      Current Outpatient Rx  Name  Route  Sig  Dispense  Refill  . amitriptyline (ELAVIL) 10 MG tablet   Oral   Take 10 mg by mouth Nightly.         Marland Kitchen amLODipine (NORVASC) 5 MG tablet   Oral   Take 5 mg by mouth daily at 6 (six) AM.         . budesonide (ENTOCORT EC) 3 MG 24 hr capsule   Oral   Take 6 mg by mouth daily at 6 (six) AM.         . hydrochlorothiazide (HYDRODIURIL) 25 MG tablet   Oral   Take 25 mg by mouth daily at 6 (six) AM.         . lisinopril (PRINIVIL,ZESTRIL) 40 MG tablet   Oral   Take 40 mg by mouth daily at 6 (six) AM.         . pantoprazole (PROTONIX) 40 MG tablet   Oral   Take 40 mg by mouth daily at 6 (six) AM.         . Capsaicin 0.1 % CREA      Apply to needed area twice daily.   1 Tube   3   . etodolac (LODINE) 400 MG tablet   Oral   Take 1 tablet (400 mg total) by mouth 2 (two) times  daily. Patient not taking: Reported on 01/24/2015   20 tablet   0     Allergies Ivp dye  History reviewed. No pertinent family history.  Social History Social History  Substance Use Topics  . Smoking status: Never Smoker   . Smokeless tobacco: Never Used  . Alcohol Use: No    Review of Systems Constitutional: Positive for subjective fever/chills. Eyes: No visual changes. ENT: Positive for sore throat. Cardiovascular: Denies chest pain. Respiratory: Positive for nonproductive cough. Denies shortness of breath. Gastrointestinal: No abdominal pain.  No nausea, no vomiting.  No diarrhea.  No constipation. Genitourinary: Negative for dysuria. Musculoskeletal: Negative for back pain. Skin: Negative for rash. Neurological: Negative for headaches, focal weakness or numbness.  10-point ROS otherwise negative.  ____________________________________________   PHYSICAL EXAM:  VITAL SIGNS: ED Triage Vitals  Enc Vitals Group     BP 06/13/15 0639 135/76 mmHg     Pulse Rate 06/13/15 0639 70     Resp 06/13/15 0639 18     Temp 06/13/15 0639 98 F (  36.7 C)     Temp Source 06/13/15 0639 Oral     SpO2 06/13/15 0639 95 %     Weight 06/13/15 0639 220 lb (99.791 kg)     Height 06/13/15 0639 5' 6"  (1.676 m)     Head Cir --      Peak Flow --      Pain Score 06/13/15 0640 7     Pain Loc --      Pain Edu? --      Excl. in [place]? --     Constitutional: Alert and oriented. Well appearing and in no acute distress. Eyes: Conjunctivae are normal. PERRL. EOMI. Ears: Bilateral TMs with mild fluid, non-erythematous. Head: Atraumatic. Nose: Congestion/rhinnorhea. Mouth/Throat: Mucous membranes are moist.  Oropharynx non-erythematous. Neck: No stridor.  Supple neck without evidence for meningismus. Cardiovascular: Normal rate, regular rhythm. Grossly normal heart sounds.  Good peripheral circulation. Respiratory: Normal respiratory effort.  No retractions. Lungs CTAB. Gastrointestinal: Soft  and nontender. No distention. No abdominal bruits. No CVA tenderness. Musculoskeletal: No lower extremity tenderness nor edema.  No joint effusions. Neurologic:  Normal speech and language. No gross focal neurologic deficits are appreciated. No gait instability. Skin:  Skin is warm, dry and intact. No rash noted. Psychiatric: Mood and affect are normal. Speech and behavior are normal.  ____________________________________________   LABS (all labs ordered are listed, but only abnormal results are displayed)  Labs Reviewed - No data to display ____________________________________________  EKG  None ____________________________________________  RADIOLOGY  None ____________________________________________   PROCEDURES  Procedure(s) performed: None  Critical Care performed: No  ____________________________________________   INITIAL IMPRESSION / ASSESSMENT AND PLAN / ED COURSE  Pertinent labs & imaging results that were available during my care of the patient were reviewed by me and considered in my medical decision making (see chart for details).  50 year old male who presents with upper respiratory infection, pharyngitis. Will treat with azithromycin, Magic mouthwash and follow-up with his PCP next week. Strict return precautions given. Patient verbalizes understanding and agrees with plan of care. ____________________________________________   FINAL CLINICAL IMPRESSION(S) / ED DIAGNOSES  Final diagnoses:  Pharyngitis  Myalgia  URI (upper respiratory infection)      Paulette Blanch, MD 06/13/15 [phone]

## 2015-06-13 NOTE — ED Notes (Signed)
Pt c/o body aches, runny nose and nonproductive cough since Friday; says he feels like he's running a fever but has not checked temp at home; pt ambulatory with steady gait; talking in complete coherent sentences

## 2015-06-13 NOTE — ED Notes (Signed)
Pt verbalized understanding of discharge instructions. NAD at this time. 

## 2015-06-30 ENCOUNTER — Ambulatory Visit: Admission: RE | Admit: 2015-06-30 | Payer: 59 | Source: Ambulatory Visit

## 2015-11-12 ENCOUNTER — Emergency Department
Admission: EM | Admit: 2015-11-12 | Discharge: 2015-11-12 | Disposition: A | Discharge status: Home / Self Care | Payer: 59 | Attending: Emergency Medicine | Admitting: Emergency Medicine

## 2015-11-12 ENCOUNTER — Encounter: Payer: Self-pay | Admitting: Emergency Medicine

## 2015-11-12 DIAGNOSIS — I10 Essential (primary) hypertension: Secondary | ICD-10-CM | POA: Diagnosis not present

## 2015-11-12 DIAGNOSIS — M130 Polyarthritis, unspecified: Secondary | ICD-10-CM | POA: Insufficient documentation

## 2015-11-12 DIAGNOSIS — Z79899 Other long term (current) drug therapy: Secondary | ICD-10-CM | POA: Insufficient documentation

## 2015-11-12 DIAGNOSIS — K5090 Crohn's disease, unspecified, without complications: Secondary | ICD-10-CM | POA: Insufficient documentation

## 2015-11-12 DIAGNOSIS — R52 Pain, unspecified: Secondary | ICD-10-CM | POA: Diagnosis present

## 2015-11-12 HISTORY — DX: Unspecified osteoarthritis, unspecified site: M19.90

## 2015-11-12 MED ORDER — MELOXICAM 15 MG PO TABS: 15.0000 mg | ORAL_TABLET | Freq: Every day | ORAL | Status: DC

## 2015-11-12 NOTE — ED Notes (Signed)
Patient presents to the ED with generalized body aches x 2 weeks.  Patient denies fever, denies vomiting and diarrhea.  Patient states, "especially in the morning I wake up and I'm really sore." Patient denies rash and denies recent tick bites.  Patient reports history of arthritis diagnosis but states previously it wasn't in his "whole body".  Patient states he especially has pain in legs, arms and shoulders.  Patient ambulatory to triage, no obvious distress at this time.

## 2015-11-12 NOTE — Discharge Instructions (Signed)
It is very important that you schedule an appointment with your primary care doctor to further evaluate the reason for your joint pain. Please call today to schedule an appointment.

## 2015-11-12 NOTE — ED Provider Notes (Signed)
[facility] Emergency Department Provider Note ____________________________________________  Time seen: Approximately 7:34 AM  I have reviewed the triage vital signs and the nursing notes.   HISTORY  Chief Complaint Generalized Body Aches    HPI Spencer Heath is a 50 y.o. male who presents to the emergency department for evaluation of generalized body aches and multiple joint tenderness that has been present for about 3 weeks.He reports in the morning when he wakes up that the joints in his extremities are stiff and sore. He states that throughout the morning it improves until he sits down at lunch and then he becomes sore again. He states that this is common in his lower back and has been told he has arthritis, but until 3 weeks ago has never felt it over his entire body. He states that the joint pain feels the same as the arthritis in his back. He has not taken anything for pain.  Past Medical History  Diagnosis Date  . Hypertension   . Crohn's disease ([place])   . Arthritis     Patient Active Problem List   Diagnosis Date Noted  . Small bowel obstruction ([place]) 01/24/2015  . Crohn's disease ([place]) 01/24/2015    Past Surgical History  Procedure Laterality Date  . Abdominal surgery    . Testicle surgery    . Appendectomy    . Testical removed      Current Outpatient Rx  Name  Route  Sig  Dispense  Refill  . EXPIRED: amitriptyline (ELAVIL) 10 MG tablet   Oral   Take 10 mg by mouth Nightly.         Marland Kitchen amLODipine (NORVASC) 5 MG tablet   Oral   Take 5 mg by mouth daily at 6 (six) AM.         . azithromycin (ZITHROMAX) 250 MG tablet   Oral   Take 1 tablet (250 mg total) by mouth daily.   4 each   0   . budesonide (ENTOCORT EC) 3 MG 24 hr capsule   Oral   Take 6 mg by mouth daily at 6 (six) AM.         . Capsaicin 0.1 % CREA      Apply to needed area twice daily.   1 Tube   3   . etodolac (LODINE) 400 MG tablet   Oral   Take 1  tablet (400 mg total) by mouth 2 (two) times daily. Patient not taking: Reported on 01/24/2015   20 tablet   0   . EXPIRED: hydrochlorothiazide (HYDRODIURIL) 25 MG tablet   Oral   Take 25 mg by mouth daily at 6 (six) AM.         . lisinopril (PRINIVIL,ZESTRIL) 40 MG tablet   Oral   Take 40 mg by mouth daily at 6 (six) AM.         . magic mouthwash SOLN   Oral   Take 5 mLs by mouth 3 (three) times daily as needed for mouth pain.   75 mL   0   . meloxicam (MOBIC) 15 MG tablet   Oral   Take 1 tablet (15 mg total) by mouth daily.   30 tablet   0   . pantoprazole (PROTONIX) 40 MG tablet   Oral   Take 40 mg by mouth daily at 6 (six) AM.           Allergies Ivp dye  No family history on file.  Social  History Social History  Substance Use Topics  . Smoking status: Never Smoker   . Smokeless tobacco: Never Used  . Alcohol Use: No    Review of Systems Constitutional: No recent illness. Cardiovascular: Denies chest pain or palpitations. Respiratory: Denies shortness of breath. Musculoskeletal: Pain in Lower back and multiple joints Skin: Negative for rash, wound, lesion. Neurological: Negative for focal weakness or numbness.  ____________________________________________   PHYSICAL EXAM:  VITAL SIGNS: ED Triage Vitals  Enc Vitals Group     BP 11/12/15 0707 132/72 mmHg     Pulse Rate 11/12/15 0707 62     Resp 11/12/15 0707 18     Temp 11/12/15 0707 98.1 F (36.7 C)     Temp Source 11/12/15 0707 Oral     SpO2 11/12/15 0707 97 %     Weight 11/12/15 0707 214 lb (97.07 kg)     Height 11/12/15 0707 5' 6"  (1.676 m)     Head Cir --      Peak Flow --      Pain Score 11/12/15 0707 7     Pain Loc --      Pain Edu? --      Excl. in [place]? --     Constitutional: Alert and oriented. Well appearing and in no acute distress. Eyes: Conjunctivae are normal. EOMI. Head: Atraumatic. Neck: No stridor.  Respiratory: Normal respiratory effort.   Musculoskeletal: Full  range of motion throughout. No erythema or edema noted of any major joint. Neurologic:  Normal speech and language. No gross focal neurologic deficits are appreciated. Speech is normal. No gait instability. Skin:  Skin is warm, dry and intact. Atraumatic. Psychiatric: Mood and affect are normal. Speech and behavior are normal.  ____________________________________________   LABS (all labs ordered are listed, but only abnormal results are displayed)  Labs Reviewed - No data to display ____________________________________________  RADIOLOGY  Not indicated ____________________________________________   PROCEDURES  Procedure(s) performed: None   ____________________________________________   INITIAL IMPRESSION / ASSESSMENT AND PLAN / ED COURSE  Pertinent labs & imaging results that were available during my care of the patient were reviewed by me and considered in my medical decision making (see chart for details).  Patient will be started on meloxicam today. He was strongly encouraged to schedule an appointment with Dr. Ginette Pitman for further evaluation. He was encouraged to return to the emergency department for symptoms that change or worsen if he is unable schedule an appointment. ____________________________________________   FINAL CLINICAL IMPRESSION(S) / ED DIAGNOSES  Final diagnoses:  [place], FNP 11/12/15 [place], MD 11/12/15 1029

## 2015-11-12 NOTE — ED Notes (Signed)
See triage note  states he has been having generalized body soreness for about 3 weeks  Denies any n/v//d or fever   Also denies any tick bites. staes this is worse in the mornings and eases off with ibu or aleve

## 2015-12-17 ENCOUNTER — Other Ambulatory Visit: Payer: Self-pay | Admitting: Internal Medicine

## 2015-12-17 DIAGNOSIS — R519 Headache, unspecified: Secondary | ICD-10-CM

## 2015-12-17 DIAGNOSIS — H539 Unspecified visual disturbance: Secondary | ICD-10-CM

## 2015-12-17 DIAGNOSIS — R51 Headache: Principal | ICD-10-CM

## 2016-01-03 ENCOUNTER — Ambulatory Visit: Payer: 59

## 2016-01-06 ENCOUNTER — Ambulatory Visit
Admission: RE | Admit: 2016-01-06 | Discharge: 2016-01-06 | Disposition: A | Discharge status: Home / Self Care | Payer: 59 | Source: Ambulatory Visit | Attending: Internal Medicine | Admitting: Internal Medicine

## 2016-01-06 DIAGNOSIS — R51 Headache: Secondary | ICD-10-CM | POA: Diagnosis not present

## 2016-01-06 DIAGNOSIS — H539 Unspecified visual disturbance: Secondary | ICD-10-CM | POA: Insufficient documentation

## 2016-01-06 DIAGNOSIS — R9089 Other abnormal findings on diagnostic imaging of central nervous system: Secondary | ICD-10-CM | POA: Diagnosis not present

## 2016-01-06 DIAGNOSIS — R519 Headache, unspecified: Secondary | ICD-10-CM

## 2016-01-06 LAB — POCT I-STAT CREATININE: CREATININE: 1.3 mg/dL — AB (ref 0.61–1.24)

## 2016-01-06 MED ORDER — GADOBENATE DIMEGLUMINE 529 MG/ML IV SOLN
20.0000 mL | Freq: Once | INTRAVENOUS | Status: AC | PRN
  Administered 2016-01-06: 20 mL via INTRAVENOUS

## 2016-02-02 ENCOUNTER — Emergency Department: Payer: 59

## 2016-02-02 ENCOUNTER — Encounter: Payer: Self-pay | Admitting: Intensive Care

## 2016-02-02 ENCOUNTER — Emergency Department
Admission: EM | Admit: 2016-02-02 | Discharge: 2016-02-02 | Disposition: A | Discharge status: Home / Self Care | Payer: 59 | Attending: Emergency Medicine | Admitting: Emergency Medicine

## 2016-02-02 DIAGNOSIS — I10 Essential (primary) hypertension: Secondary | ICD-10-CM | POA: Diagnosis not present

## 2016-02-02 DIAGNOSIS — Z79899 Other long term (current) drug therapy: Secondary | ICD-10-CM | POA: Insufficient documentation

## 2016-02-02 DIAGNOSIS — M19011 Primary osteoarthritis, right shoulder: Secondary | ICD-10-CM | POA: Insufficient documentation

## 2016-02-02 DIAGNOSIS — M19012 Primary osteoarthritis, left shoulder: Secondary | ICD-10-CM

## 2016-02-02 DIAGNOSIS — M25512 Pain in left shoulder: Secondary | ICD-10-CM | POA: Diagnosis present

## 2016-02-02 MED ORDER — NAPROXEN 500 MG PO TABS
ORAL_TABLET | ORAL | Status: AC
  Administered 2016-02-02: 500 mg via ORAL
  Filled 2016-02-02: qty 1

## 2016-02-02 MED ORDER — HYDROCODONE-ACETAMINOPHEN 5-325 MG PO TABS: 1.0000 | ORAL_TABLET | ORAL | 0 refills | Status: AC | PRN

## 2016-02-02 MED ORDER — MELOXICAM 15 MG PO TABS: 15.0000 mg | ORAL_TABLET | Freq: Every day | ORAL | 0 refills | Status: DC

## 2016-02-02 MED ORDER — NAPROXEN 500 MG PO TABS
500.0000 mg | ORAL_TABLET | Freq: Once | ORAL | Status: AC
  Administered 2016-02-02: 500 mg via ORAL

## 2016-02-02 NOTE — ED Triage Notes (Addendum)
Pt presents to ER with L shoulder pain. Denies injury/trauma. States "every morning it hurts and it has been xrayed here a couple months ago and they found nothing wrong"  Pt takes aleve at home with minimal relief

## 2016-02-02 NOTE — ED Provider Notes (Signed)
[facility] Emergency Department Provider Note ____________________________________________  Time seen: Approximately 7:23 AM  I have reviewed the triage vital signs and the nursing notes.   HISTORY  Chief Complaint Shoulder Pain (L side)    HPI Spencer Heath is a 50 y.o. male who presents to the emergency department for evaluation of left shoulder pain. He was evaluated for the same about a year ago. He states that the pain is becoming daily, especially upon awakening. He denies recent injury. He is right hand dominant. He is takingaleve without relief. He has not been evaluated by orthopedics for his shoulder pain. Pain increases with stretching or attempting to abduct and externally rotate the shoulder. No paresthesias.   Past Medical History:  Diagnosis Date  . Arthritis   . Crohn's disease ([place])   . Hypertension     Patient Active Problem List   Diagnosis Date Noted  . Small bowel obstruction ([place]) 01/24/2015  . Crohn's disease ([place]) 01/24/2015    Past Surgical History:  Procedure Laterality Date  . ABDOMINAL SURGERY    . APPENDECTOMY    . testical removed    . TESTICLE SURGERY      Prior to Admission medications   Medication Sig Start Date End Date Taking? Authorizing Provider  amitriptyline (ELAVIL) 10 MG tablet Take 10 mg by mouth Nightly. 08/13/14 08/13/15  Historical Provider, MD  amLODipine (NORVASC) 5 MG tablet Take 5 mg by mouth daily at 6 (six) AM. 08/31/14   Historical Provider, MD  azithromycin (ZITHROMAX) 250 MG tablet Take 1 tablet (250 mg total) by mouth daily. 06/13/15   Paulette Blanch, MD  budesonide (ENTOCORT EC) 3 MG 24 hr capsule Take 6 mg by mouth daily at 6 (six) AM. 12/17/14   Historical Provider, MD  Capsaicin 0.1 % CREA Apply to needed area twice daily. 12/26/14   Pierce Crane Beers, PA-C  hydrochlorothiazide (HYDRODIURIL) 25 MG tablet Take 25 mg by mouth daily at 6 (six) AM. 07/09/14 07/09/15  Historical Provider, MD   HYDROcodone-acetaminophen (NORCO/VICODIN) 5-325 MG tablet Take 1 tablet by mouth every 4 (four) hours as needed for moderate pain. 02/02/16 02/01/17  Victorino Dike, FNP  lisinopril (PRINIVIL,ZESTRIL) 40 MG tablet Take 40 mg by mouth daily at 6 (six) AM. 09/28/14   Historical Provider, MD  magic mouthwash SOLN Take 5 mLs by mouth 3 (three) times daily as needed for mouth pain. 06/13/15   Paulette Blanch, MD  meloxicam (MOBIC) 15 MG tablet Take 1 tablet (15 mg total) by mouth daily. 02/02/16   Victorino Dike, FNP  pantoprazole (PROTONIX) 40 MG tablet Take 40 mg by mouth daily at 6 (six) AM. 07/31/14   Historical Provider, MD    Allergies Ivp dye [iodinated diagnostic agents]  History reviewed. No pertinent family history.  Social History Social History  Substance Use Topics  . Smoking status: Never Smoker  . Smokeless tobacco: Never Used  . Alcohol use No    Review of Systems Constitutional: No recent illness. Cardiovascular: Denies chest pain or palpitations. Respiratory: Denies shortness of breath. Musculoskeletal: Pain in left shoulder. Skin: Negative for rash, wound, lesion. Neurological: Negative for focal weakness or numbness.  ____________________________________________   PHYSICAL EXAM:  VITAL SIGNS: ED Triage Vitals  Enc Vitals Group     BP 02/02/16 0707 134/85     Pulse Rate 02/02/16 0707 61     Resp 02/02/16 0707 16     Temp 02/02/16 0707 97.7 F (36.5 C)  Temp Source 02/02/16 0707 Oral     SpO2 02/02/16 0707 98 %     Weight 02/02/16 0708 213 lb (96.6 kg)     Height 02/02/16 0708 5' 5"  (1.651 m)     Head Circumference --      Peak Flow --      Pain Score 02/02/16 0708 7     Pain Loc --      Pain Edu? --      Excl. in [place]? --     Constitutional: Alert and oriented. Well appearing and in no acute distress. Eyes: Conjunctivae are normal. EOMI. Head: Atraumatic. Neck: No stridor.  Respiratory: Normal respiratory effort.   Musculoskeletal: Limited abduction  and external rotation of the left shoulder due to pain. No crepitus on exam. No step-off deformity. Good strength on exam. Neurologic:  Normal speech and language. No gross focal neurologic deficits are appreciated. Speech is normal. No gait instability. Skin:  Skin is warm, dry and intact. Atraumatic. Psychiatric: Mood and affect are normal. Speech and behavior are normal.  ____________________________________________   LABS (all labs ordered are listed, but only abnormal results are displayed)  Labs Reviewed - No data to display ____________________________________________  RADIOLOGY  Degeneration at the [facility] joint of the left shoulder. No acute abnormality per radiology. ____________________________________________   PROCEDURES  Procedure(s) performed: None   ____________________________________________   INITIAL IMPRESSION / ASSESSMENT AND PLAN / ED COURSE  Clinical Course    Pertinent labs & imaging results that were available during my care of the patient were reviewed by me and considered in my medical decision making (see chart for details).  Patient given Rx for meloxicam and Norco (#12). He is to call and schedule a follow up with orthopedics. He is to return to the ER for symptoms that change or worsen if unable to schedule an appointment with PCP or orthopedics. ____________________________________________   FINAL CLINICAL IMPRESSION(S) / ED DIAGNOSES  Final diagnoses:  Osteoarthritis of left acromioclavicular joint       Victorino Dike, FNP 02/02/16 [place], FNP 02/02/16 6759    Delman Kitten, MD 02/02/16 1541

## 2016-05-01 ENCOUNTER — Emergency Department: Payer: 59

## 2016-05-01 ENCOUNTER — Emergency Department
Admission: EM | Admit: 2016-05-01 | Discharge: 2016-05-01 | Disposition: A | Discharge status: Home / Self Care | Payer: 59 | Attending: Emergency Medicine | Admitting: Emergency Medicine

## 2016-05-01 ENCOUNTER — Encounter: Payer: Self-pay | Admitting: Emergency Medicine

## 2016-05-01 DIAGNOSIS — M25562 Pain in left knee: Secondary | ICD-10-CM | POA: Diagnosis present

## 2016-05-01 DIAGNOSIS — G8929 Other chronic pain: Secondary | ICD-10-CM | POA: Diagnosis not present

## 2016-05-01 DIAGNOSIS — I10 Essential (primary) hypertension: Secondary | ICD-10-CM | POA: Diagnosis not present

## 2016-05-01 DIAGNOSIS — Z79899 Other long term (current) drug therapy: Secondary | ICD-10-CM | POA: Insufficient documentation

## 2016-05-01 DIAGNOSIS — M25512 Pain in left shoulder: Secondary | ICD-10-CM | POA: Insufficient documentation

## 2016-05-01 MED ORDER — TRAMADOL HCL 50 MG PO TABS: 50.0000 mg | ORAL_TABLET | Freq: Four times a day (QID) | ORAL | 0 refills | Status: AC | PRN

## 2016-05-01 MED ORDER — TRAMADOL HCL 50 MG PO TABS
50.0000 mg | ORAL_TABLET | Freq: Once | ORAL | Status: AC
Start: 2016-05-01 — End: 2016-05-01
  Administered 2016-05-01: 50 mg via ORAL
  Filled 2016-05-01: qty 1

## 2016-05-01 NOTE — ED Provider Notes (Signed)
[facility] Emergency Department Provider Note  Time seen: 8:42 AM  I have reviewed the triage vital signs and the nursing notes.   HISTORY  Chief Complaint Knee Pain    HPI Spencer Heath is a 50 y.o. male with a past medical history of arthritis, Crohn's and hypertension, presents to the emergency department with left knee pain. According to the patient for the past 2 or 3 months he has been expressing left knee pain, worse with movement. He also is complaining of left shoulder pain but this has been ongoing for the past 1-2 years. Patient states his left knee pain was worse last night, preventing him from sleeping at times so he came to the emergency department today for evaluation. Patient denies any trauma. States he has seen his doctor for the same and has been referred to orthopedics but has not yet seen an orthopedist. Patient denies any fever. Denies any falls. States the pain is minimal at rest in bed but comes moderate with movement.  Past Medical History:  Diagnosis Date  . Arthritis   . Crohn's disease ([place])   . Hypertension     Patient Active Problem List   Diagnosis Date Noted  . Small bowel obstruction 01/24/2015  . Crohn's disease ([place]) 01/24/2015    Past Surgical History:  Procedure Laterality Date  . ABDOMINAL SURGERY    . APPENDECTOMY    . testical removed    . TESTICLE SURGERY      Prior to Admission medications   Medication Sig Start Date End Date Taking? Authorizing Provider  amitriptyline (ELAVIL) 10 MG tablet Take 10 mg by mouth Nightly. 08/13/14 08/13/15  Historical Provider, MD  amLODipine (NORVASC) 5 MG tablet Take 5 mg by mouth daily at 6 (six) AM. 08/31/14   Historical Provider, MD  azithromycin (ZITHROMAX) 250 MG tablet Take 1 tablet (250 mg total) by mouth daily. 06/13/15   Paulette Blanch, MD  budesonide (ENTOCORT EC) 3 MG 24 hr capsule Take 6 mg by mouth daily at 6 (six) AM. 12/17/14   Historical Provider, MD  Capsaicin 0.1  % CREA Apply to needed area twice daily. 12/26/14   Pierce Crane Beers, PA-C  hydrochlorothiazide (HYDRODIURIL) 25 MG tablet Take 25 mg by mouth daily at 6 (six) AM. 07/09/14 07/09/15  Historical Provider, MD  HYDROcodone-acetaminophen (NORCO/VICODIN) 5-325 MG tablet Take 1 tablet by mouth every 4 (four) hours as needed for moderate pain. 02/02/16 02/01/17  Victorino Dike, FNP  lisinopril (PRINIVIL,ZESTRIL) 40 MG tablet Take 40 mg by mouth daily at 6 (six) AM. 09/28/14   Historical Provider, MD  magic mouthwash SOLN Take 5 mLs by mouth 3 (three) times daily as needed for mouth pain. 06/13/15   Paulette Blanch, MD  meloxicam (MOBIC) 15 MG tablet Take 1 tablet (15 mg total) by mouth daily. 02/02/16   Victorino Dike, FNP  pantoprazole (PROTONIX) 40 MG tablet Take 40 mg by mouth daily at 6 (six) AM. 07/31/14   Historical Provider, MD    Allergies  Allergen Reactions  . Ivp Dye [Iodinated Diagnostic Agents] Hives    No family history on file.  Social History Social History  Substance Use Topics  . Smoking status: Never Smoker  . Smokeless tobacco: Never Used  . Alcohol use No    Review of Systems Constitutional: Negative for fever Cardiovascular: Negative for chest pain. Respiratory: Negative for shortness of breath. Gastrointestinal: Negative for abdominal pain Musculoskeletal: Left knee pain. Left shoulder pain.  Neurological: Negative for headache 10-point ROS otherwise negative.  ____________________________________________   PHYSICAL EXAM:  VITAL SIGNS: ED Triage Vitals  Enc Vitals Group     BP 05/01/16 0719 118/79     Pulse Rate 05/01/16 0719 64     Resp 05/01/16 0719 18     Temp 05/01/16 0719 97.7 F (36.5 C)     Temp Source 05/01/16 0719 Oral     SpO2 05/01/16 0719 95 %     Weight 05/01/16 0720 220 lb (99.8 kg)     Height 05/01/16 0720 5' 5"  (1.651 m)     Head Circumference --      Peak Flow --      Pain Score 05/01/16 0721 7     Pain Loc --      Pain Edu? --      Excl. in  [place]? --     Constitutional: Alert and oriented. Well appearing and in no distress. Eyes: Normal exam ENT   Head: Normocephalic and atraumatic.   Mouth/Throat: Mucous membranes are moist. Cardiovascular: Normal rate, regular rhythm. No murmur Respiratory: Normal respiratory effort without tachypnea nor retractions. Breath sounds are clear Gastrointestinal: Soft and nontender. No distention Musculoskeletal: Mild tenderness to palpation of the left knee, no erythema noted, no effusion noted. Good range of motion in the left shoulder although the patient does state mild discomfort with range of motion. Neurologic:  Normal speech and language. No gross focal neurologic deficits  Skin:  Skin is warm, dry and intact.  Psychiatric: Mood and affect are normal.   ____________________________________________   RADIOLOGY  X-ray shows loss of joint space in the medial compartment otherwise negative.  ____________________________________________   INITIAL IMPRESSION / ASSESSMENT AND PLAN / ED COURSE  Pertinent labs & imaging results that were available during my care of the patient were reviewed by me and considered in my medical decision making (see chart for details).  The patient presents the emergency department with left knee pain for the past 2 or 3 months but worse over the past 24 hours, causing him to not sleep well last night. On exam the knee appears normal, no effusion noted, no erythema or swelling, the patient does have mild pain elicited with range of motion, neurovascular intact distally. Given the patient's increased pain we will obtain an x-ray to evaluate. As a secondary complaint the patient states continued left shoulder pain but states this is unchanged greater in one year. Denies any acute exacerbation. Patient has been referred to orthopedics and is currently awaiting his appointment. If the x-ray is within normal limits. We will discharge with a short course of Ultram  to allow for some pain relief and to the patient can be seen by his orthopedist. I discussed with the patient also following up with his primary care doctor for further pain relief if needed.  X-ray shows loss of joint space in the medial compartment, patient will follow-up with orthopedics. We will discharge with a short course of Ultram. Patient agreeable to plan.  ____________________________________________   FINAL CLINICAL IMPRESSION(S) / ED DIAGNOSES  Knee pain    Harvest Dark, MD 05/01/16 [phone]

## 2016-05-01 NOTE — ED Notes (Signed)
Pt c/o left shoulder and left knee pain for "a couple of months"; says he's here this am because he couldn't sleep last night; ambulatory with steady gait

## 2016-05-01 NOTE — ED Triage Notes (Signed)
L knee pain x 2 months, denies fall at onset.

## 2016-05-01 NOTE — ED Notes (Signed)
X-ray at bedside

## 2016-08-19 ENCOUNTER — Emergency Department
Admission: EM | Admit: 2016-08-19 | Discharge: 2016-08-19 | Disposition: A | Discharge status: Home / Self Care | Payer: 59 | Attending: Emergency Medicine | Admitting: Emergency Medicine

## 2016-08-19 DIAGNOSIS — I10 Essential (primary) hypertension: Secondary | ICD-10-CM | POA: Diagnosis not present

## 2016-08-19 DIAGNOSIS — G43909 Migraine, unspecified, not intractable, without status migrainosus: Secondary | ICD-10-CM | POA: Insufficient documentation

## 2016-08-19 DIAGNOSIS — Z79899 Other long term (current) drug therapy: Secondary | ICD-10-CM | POA: Insufficient documentation

## 2016-08-19 DIAGNOSIS — R51 Headache: Secondary | ICD-10-CM | POA: Diagnosis present

## 2016-08-19 MED ORDER — SODIUM CHLORIDE 0.9 % IV BOLUS (SEPSIS)
1000.0000 mL | Freq: Once | INTRAVENOUS | Status: AC
  Administered 2016-08-19: 1000 mL via INTRAVENOUS

## 2016-08-19 MED ORDER — KETOROLAC TROMETHAMINE 30 MG/ML IJ SOLN
30.0000 mg | Freq: Once | INTRAMUSCULAR | Status: AC
  Administered 2016-08-19: 30 mg via INTRAVENOUS
  Filled 2016-08-19: qty 1

## 2016-08-19 MED ORDER — METOCLOPRAMIDE HCL 5 MG/ML IJ SOLN
10.0000 mg | Freq: Once | INTRAMUSCULAR | Status: AC
  Administered 2016-08-19: 10 mg via INTRAVENOUS
  Filled 2016-08-19: qty 2

## 2016-08-19 MED ORDER — DIPHENHYDRAMINE HCL 50 MG/ML IJ SOLN
25.0000 mg | Freq: Once | INTRAMUSCULAR | Status: AC
  Administered 2016-08-19: 25 mg via INTRAVENOUS
  Filled 2016-08-19: qty 1

## 2016-08-19 MED ORDER — BUTALBITAL-APAP-CAFFEINE 50-325-40 MG PO TABS: 1.0000 | ORAL_TABLET | Freq: Four times a day (QID) | ORAL | 0 refills | Status: AC | PRN

## 2016-08-19 NOTE — ED Provider Notes (Signed)
[facility] Emergency Department Provider Note  Time seen: 7:12 AM  I have reviewed the triage vital signs and the nursing notes.   HISTORY  Chief Complaint Headache    HPI Spencer Heath is a 51 y.o. male  with a past medical history of Crohn's disease, hypertension, arthritis, presents to the emergency department with a headache. According to the patient for the past 3 or 4 days he has had a chronic headache to the center of his forehead. States will case and given a numbness and tingling sensation in his feet as well. Patient states a long history of headaches, states he gets headaches this bad every few months which last several days. He has received ER medications in the past for his headaches but states he has never been formally diagnosed as having a migraine disorder. Denies any focal weakness. Denies any confusion or slurred speech. Currently the patient appears well, calm and cooperative lying in bed in no distress.  Past Medical History:  Diagnosis Date  . Arthritis   . Crohn's disease ([place])   . Hypertension     Patient Active Problem List   Diagnosis Date Noted  . Small bowel obstruction 01/24/2015  . Crohn's disease ([place]) 01/24/2015    Past Surgical History:  Procedure Laterality Date  . ABDOMINAL SURGERY    . APPENDECTOMY    . testical removed    . TESTICLE SURGERY      Prior to Admission medications   Medication Sig Start Date End Date Taking? Authorizing Provider  amitriptyline (ELAVIL) 10 MG tablet Take 10 mg by mouth Nightly. 08/13/14 08/13/15  Historical Provider, MD  amLODipine (NORVASC) 5 MG tablet Take 5 mg by mouth daily at 6 (six) AM. 08/31/14   Historical Provider, MD  azithromycin (ZITHROMAX) 250 MG tablet Take 1 tablet (250 mg total) by mouth daily. 06/13/15   Paulette Blanch, MD  budesonide (ENTOCORT EC) 3 MG 24 hr capsule Take 6 mg by mouth daily at 6 (six) AM. 12/17/14   Historical Provider, MD  Capsaicin 0.1 % CREA Apply to  needed area twice daily. 12/26/14   Pierce Crane Beers, PA-C  hydrochlorothiazide (HYDRODIURIL) 25 MG tablet Take 25 mg by mouth daily at 6 (six) AM. 07/09/14 07/09/15  Historical Provider, MD  HYDROcodone-acetaminophen (NORCO/VICODIN) 5-325 MG tablet Take 1 tablet by mouth every 4 (four) hours as needed for moderate pain. 02/02/16 02/01/17  Victorino Dike, FNP  lisinopril (PRINIVIL,ZESTRIL) 40 MG tablet Take 40 mg by mouth daily at 6 (six) AM. 09/28/14   Historical Provider, MD  magic mouthwash SOLN Take 5 mLs by mouth 3 (three) times daily as needed for mouth pain. 06/13/15   Paulette Blanch, MD  meloxicam (MOBIC) 15 MG tablet Take 1 tablet (15 mg total) by mouth daily. 02/02/16   Victorino Dike, FNP  pantoprazole (PROTONIX) 40 MG tablet Take 40 mg by mouth daily at 6 (six) AM. 07/31/14   Historical Provider, MD  traMADol (ULTRAM) 50 MG tablet Take 1 tablet (50 mg total) by mouth every 6 (six) hours as needed. 05/01/16 05/01/17  Harvest Dark, MD    Allergies  Allergen Reactions  . Ivp Dye [Iodinated Diagnostic Agents] Hives    No family history on file.  Social History Social History  Substance Use Topics  . Smoking status: Never Smoker  . Smokeless tobacco: Never Used  . Alcohol use No    Review of Systems Constitutional: Negative for fever. Cardiovascular: Negative for chest  pain. Respiratory: Negative for shortness of breath. Gastrointestinal: Negative for abdominal pain Neurological: Moderate headache. Denies any focal weakness. States a tingling sensation in his feet at times but denies any currently. 10-point ROS otherwise negative.  ____________________________________________   PHYSICAL EXAM:  VITAL SIGNS: ED Triage Vitals  Enc Vitals Group     BP 08/19/16 0615 137/86     Pulse Rate 08/19/16 0615 65     Resp 08/19/16 0615 18     Temp 08/19/16 0615 98 F (36.7 C)     Temp Source 08/19/16 0615 Oral     SpO2 08/19/16 0615 99 %     Weight 08/19/16 0600 213 lb (96.6 kg)      Height 08/19/16 0600 5' 5"  (1.651 m)     Head Circumference --      Peak Flow --      Pain Score 08/19/16 0600 8     Pain Loc --      Pain Edu? --      Excl. in [place]? --     Constitutional: Alert and oriented. Well appearing and in no distress. Eyes: Normal exam ENT   Head: Normocephalic and atraumatic.   Mouth/Throat: Mucous membranes are moist. Cardiovascular: Normal rate, regular rhythm. No murmur Respiratory: Normal respiratory effort without tachypnea nor retractions. Breath sounds are clear Gastrointestinal: Soft and nontender. No distention.  Musculoskeletal: Nontender with normal range of motion in all extremities Neurologic:  Normal speech and language. No gross focal neurologic deficits. Equal grip strengths. No pronator drift. Cranial nerves intact. Skin:  Skin is warm, dry and intact.  Psychiatric: Mood and affect are normal. Speech and behavior are normal.   ____________________________________________   INITIAL IMPRESSION / ASSESSMENT AND PLAN / ED COURSE  Pertinent labs & imaging results that were available during my care of the patient were reviewed by me and considered in my medical decision making (see chart for details).  The patient presents to the emergency department with a headache for the past several days. States moderate in severity, has taken ibuprofen and BC powder at home without relief so he came to the emergency department. Patient states it is been several months since he has had a headache this bad. Patient has an intact neurological exam, overall appears well. We will treat with a migraine cocktail, IV fluids and closely monitor.  Patient states his headache is much improved. He is asking to be discharged home with a work note. We will discharge with Fioricet for any residual headache. Patient continues to appear very well, no distress. Patient was sleeping comfortably prior to my  reevaluation.  ____________________________________________   FINAL CLINICAL IMPRESSION(S) / ED DIAGNOSES  Migraine headache    Harvest Dark, MD 08/19/16 [phone]

## 2016-08-19 NOTE — ED Triage Notes (Signed)
Pt ambulatory to triage with no difficulty. Pt reports he has a headache since Thursday. Pain to the forehead area and across the back of his head. Pt reports he took ibuprofen 289m around 11 pm with a little relief. Pt also reports he took a BC powder around 3 pm.

## 2016-12-31 ENCOUNTER — Emergency Department
Admission: EM | Admit: 2016-12-31 | Discharge: 2016-12-31 | Disposition: A | Discharge status: Home / Self Care | Payer: BLUE CROSS/BLUE SHIELD | Attending: Emergency Medicine | Admitting: Emergency Medicine

## 2016-12-31 DIAGNOSIS — I10 Essential (primary) hypertension: Secondary | ICD-10-CM | POA: Insufficient documentation

## 2016-12-31 DIAGNOSIS — Z79899 Other long term (current) drug therapy: Secondary | ICD-10-CM | POA: Diagnosis not present

## 2016-12-31 DIAGNOSIS — M545 Low back pain: Secondary | ICD-10-CM | POA: Diagnosis present

## 2016-12-31 DIAGNOSIS — M62830 Muscle spasm of back: Secondary | ICD-10-CM

## 2016-12-31 MED ORDER — KETOROLAC TROMETHAMINE 60 MG/2ML IM SOLN
30.0000 mg | Freq: Once | INTRAMUSCULAR | Status: AC
  Administered 2016-12-31: 30 mg via INTRAMUSCULAR
  Filled 2016-12-31: qty 2

## 2016-12-31 MED ORDER — CYCLOBENZAPRINE HCL 5 MG PO TABS: 5.0000 mg | ORAL_TABLET | Freq: Three times a day (TID) | ORAL | 0 refills | Status: DC | PRN

## 2016-12-31 MED ORDER — KETOROLAC TROMETHAMINE 10 MG PO TABS: 10.0000 mg | ORAL_TABLET | Freq: Three times a day (TID) | ORAL | 0 refills | Status: DC

## 2016-12-31 NOTE — ED Provider Notes (Signed)
[facility] Emergency Department Provider Note ____________________________________________  Time seen: 1015  I have reviewed the triage vital signs and the nursing notes.  HISTORY  Chief Complaint  Back Pain  HPI Spencer Heath is a 51 y.o. male resistance to the ED for evaluation of 2 weeks complaint of intermittent low back pain and spasm. The patient gives a history of mild degenerative disc disease as well as Crohn's colitis. He reports intermittent gravity spasms to the right lower back. He reports pain is different from his sciatic nerve irritation. He denies any interim fall, trauma, or accident. He denies any chest pain, shortness breath, or fevers. He has in the past treated his low back pain and left-sided sciatica with muscle relaxants and anti-inflammatories. He presents now noting limited benefit with over-the-counter ibuprofen in the interim. He denies any distal paresthesias, foot drop, or incontinence.  Past Medical History:  Diagnosis Date  . Arthritis   . Crohn's disease ([place])   . Hypertension     Patient Active Problem List   Diagnosis Date Noted  . Small bowel obstruction ([place]) 01/24/2015  . Crohn's disease ([place]) 01/24/2015    Past Surgical History:  Procedure Laterality Date  . ABDOMINAL SURGERY    . APPENDECTOMY    . testical removed    . TESTICLE SURGERY      Prior to Admission medications   Medication Sig Start Date End Date Taking? Authorizing Provider  amitriptyline (ELAVIL) 10 MG tablet Take 10 mg by mouth Nightly. 08/13/14 08/19/16  [provider]  amLODipine (NORVASC) 5 MG tablet Take 5 mg by mouth daily at 6 (six) AM. 08/31/14   [provider]  azaTHIOprine (IMURAN) 50 MG tablet Take 50 mg by mouth 4 (four) times daily. 07/21/16   [provider]  butalbital-acetaminophen-caffeine (FIORICET, ESGIC) 50-325-40 MG tablet Take 1-2 tablets by mouth every 6 (six) hours as needed for headache. 08/19/16  08/19/17  Harvest Dark, MD  Capsaicin 0.1 % CREA Apply to needed area twice daily. Patient not taking: Reported on 08/19/2016 12/26/14   Beers, Pierce Crane, PA-C  cyclobenzaprine (FLEXERIL) 5 MG tablet Take 1 tablet (5 mg total) by mouth 3 (three) times daily as needed for muscle spasms. 12/31/16   Tekeshia Klahr, Dannielle Karvonen, PA-C  hydrochlorothiazide (HYDRODIURIL) 25 MG tablet Take 25 mg by mouth daily at 6 (six) AM. 07/09/14 08/19/16  [provider]  HYDROcodone-acetaminophen (NORCO/VICODIN) 5-325 MG tablet Take 1 tablet by mouth every 4 (four) hours as needed for moderate pain. Patient not taking: Reported on 08/19/2016 02/02/16 02/01/17  Sherrie George B, FNP  inFLIXimab (REMICADE) 100 MG injection Inject 100 mg into the vein every 8 (eight) weeks.    [provider]  ketorolac (TORADOL) 10 MG tablet Take 1 tablet (10 mg total) by mouth every 8 (eight) hours. 12/31/16   Tiye Huwe, Dannielle Karvonen, PA-C  lisinopril (PRINIVIL,ZESTRIL) 40 MG tablet Take 40 mg by mouth daily at 6 (six) AM. 09/28/14   [provider]  magic mouthwash SOLN Take 5 mLs by mouth 3 (three) times daily as needed for mouth pain. Patient not taking: Reported on 08/19/2016 06/13/15   Paulette Blanch, MD  meloxicam (MOBIC) 15 MG tablet Take 1 tablet (15 mg total) by mouth daily. Patient not taking: Reported on 08/19/2016 02/02/16   Sherrie George B, FNP  traMADol (ULTRAM) 50 MG tablet Take 1 tablet (50 mg total) by mouth every 6 (six) hours as needed. Patient not taking: Reported on  08/19/2016 05/01/16 05/01/17  Harvest Dark, MD    Allergies Ivp dye [iodinated diagnostic agents]  History reviewed. No pertinent family history.  Social History Social History  Substance Use Topics  . Smoking status: Never Smoker  . Smokeless tobacco: Never Used  . Alcohol use No    Review of Systems  Constitutional: Negative for fever. Cardiovascular: Negative for chest pain. Respiratory: Negative for shortness of  breath. Gastrointestinal: Negative for abdominal pain, vomiting and diarrhea. Genitourinary: Negative for dysuria. Musculoskeletal: Positive for back pain. Neurological: Negative for headaches, focal weakness or numbness. ____________________________________________  PHYSICAL EXAM:  VITAL SIGNS: ED Triage Vitals  Enc Vitals Group     BP 12/31/16 1001 133/73     Pulse Rate 12/31/16 1001 69     Resp 12/31/16 1001 18     Temp 12/31/16 1001 98.3 F (36.8 C)     Temp Source 12/31/16 1001 Oral     SpO2 12/31/16 1001 97 %     Weight 12/31/16 1003 213 lb (96.6 kg)     Height 12/31/16 1003 5' 5"  (1.651 m)     Head Circumference --      Peak Flow --      Pain Score 12/31/16 1002 8     Pain Loc --      Pain Edu? --      Excl. in [place]? --     Constitutional: Alert and oriented. Well appearing and in no distress. Head: Normocephalic and atraumatic. Cardiovascular: Normal rate, regular rhythm. Normal distal pulses. Respiratory: Normal respiratory effort. No wheezes/rales/rhonchi. Gastrointestinal: Soft and nontender. No distention. Musculoskeletal: Normal spinal alignment without midline tenderness, spasm, deformity, or step-off. Patient with minimal tenderness to palpation over the right lumbar sacral junction. Normal hip flexion and extension range. Nontender with normal range of motion in all extremities.  Neurologic: Cranial nerves II through XII grossly intact. Normal LE DTRs bilaterally. Normal toe dorsiflexion and foot eversion. Negative seated straight leg raise. Normal gait without ataxia. Normal speech and language. No gross focal neurologic deficits are appreciated. ____________________________________________  PROCEDURES  Toradol 30 mg IM ____________________________________________  INITIAL IMPRESSION / ASSESSMENT AND PLAN / ED COURSE  Patient was ED evaluation of acute lumbosacral muscle spasm. Patient presents here for an injection of medicine and muscle relaxant  prescription. He denies any distal paresthesias. His exam is benign without any acute neuromuscular deficit. He'll be discharged with a prescription for Toradol and Flexeril to dose as directed. Report was provided for today as requested. He should follow with Dr. Ginette Pitman for ongoing symptom management. ____________________________________________  FINAL CLINICAL IMPRESSION(S) / ED DIAGNOSES  Final diagnoses:  Muscle spasm of back      Melvenia Needles, PA-C 12/31/16 1118    Lisa Roca, MD 12/31/16 1524

## 2016-12-31 NOTE — ED Triage Notes (Signed)
Pt states having back spasms for 2 weeks.  Pain is 8/10.  Pt is A&Ox4

## 2016-12-31 NOTE — Discharge Instructions (Signed)
Your exam is essentially normal today. Take the prescription meds as directed. Follow-up with Dr. Ginette Pitman for ongoing symptoms.

## 2017-03-21 ENCOUNTER — Encounter: Payer: BLUE CROSS/BLUE SHIELD | Attending: Internal Medicine | Admitting: Dietician

## 2017-03-21 ENCOUNTER — Encounter: Payer: Self-pay | Admitting: Dietician

## 2017-03-21 VITALS — Ht 66.0 in | Wt 224.0 lb

## 2017-03-21 DIAGNOSIS — I10 Essential (primary) hypertension: Secondary | ICD-10-CM

## 2017-03-21 DIAGNOSIS — Z6836 Body mass index (BMI) 36.0-36.9, adult: Secondary | ICD-10-CM

## 2017-03-21 DIAGNOSIS — E6609 Other obesity due to excess calories: Secondary | ICD-10-CM

## 2017-03-21 NOTE — Progress Notes (Signed)
Medical Nutrition Therapy: Visit start time: 1330  end time: 1430  Assessment:  Diagnosis: HTN, obesity Past medical history: Crohn's disease, GERD, Low Vitamin D, Anemia Psychosocial issues/ stress concerns: none Preferred learning method:  . Auditory  Current weight: 224#  Height: 5' 6"  Medications, supplements: norvasc, flexeril, HCTZ, tramadol, see chart  Progress and evaluation: Patient's main interests are learning how to eat better to lose weight and to get off of his blood pressure medication. Goal wt 180#. Dietary interventions to date include cutting out pork-based breakfast meats such as bacon and sausage and choosing [place] varieties instead, and using less salt when cooking. Current diet is high in fat and added sugars d/t frequent snacking on processed foods. Eats a lot of cheese, peanut butter and fried foods more often than other cooked meat options. Repots lack of knowledge on portion sizes but believes to be eating too much. He is unsure of which foods contain carbohydrates and which protein sources are considered "lean." Does not typically eat vegetables. He is currently trying to get back on a regular exercise schedule.   Physical activity: 2-3x/wk if being consistent. Skips weeks depending on current work schedule Type of activity: cardio + weight training for 45 min each session  Dietary Intake:  Usual eating pattern includes 3 meals and 3 snacks per day. Dining out frequency: 10-11 meals per week.  Breakfast: cereal IE Cinnamon toast crunch, [place], 2 fried eggs with cheese on a biscuit Snack: vending machine at work- chips, cookies, m&ms, recies cups Lunch: [employer] crispy chicken sandwich + sweet tea Snack: ice cream, cookies Supper: [employer], [place], [place] 1x/month, fried chicken at [employer]. Home: baked meats + BBQ sauce / bread, beans, mustard greens yesterday for the first time in 'years', ground [place] + spaghetti  or lasagna Snack: ice cream, popcorn Beverages: 1-2 sodas/ day, sweet tea when eating out, Gatorade, water  Nutrition Care Education: Topics covered: fiber and Crohn's disease, common nutritional deficiencies with Crohn's disease IE fat-soluble vitamins, sugar sweetened beverages, lower fat eating, selecting more whole-food based snacks and desserts, choosing less fried foods, sources of healthy fats, omega-3 fatty acids and joint health, portion sizes  Basic nutrition: basic food groups, appropriate nutrient balance, appropriate meal and snack schedule, general nutrition guidelines    Weight control: benefits of weight control, behavioral changes for weight loss Advanced nutrition: cooking techniques, dining out Hypertension:  importance of controlling BP, identifying high sodium foods Other lifestyle changes:  benefits of making changes, increasing motivation, readiness for change, identifying habits that need to change  Nutritional Diagnosis:  NB-1.1 Food and nutrition-related knowledge deficit As related to lack of prior nutrition education.  As evidenced by patient's inability to name sources of carbohydrates, lack of vegetable intake and lack of knowledge on food portions. [place]-3.3 Overweight/obesity As related to lifestyle habits.  As evidenced by BMI 36.15.  Intervention: Discussion as noted above. Goal list created with patient, instructing him to choose 1-2 goals to work on at a time. First goal(s) will be to include more sources of vegetables in the diet, monitor portions more closely, and start to decrease sugar sweetened beverage consumption.   Education Materials given:  . General diet guidelines for Hypertension . Quick and [employer] handout . Goals/ instructions  Learner/ who was taught:  . Patient  Level of understanding: . Partial understanding; needs review/ practice  Demonstrated degree of understanding via:   Teach back Learning barriers: . None  Willingness to  learn/ readiness for change: . Acceptance, ready for change  Monitoring and Evaluation:  Dietary intake, exercise, and body weight      follow up: prn

## 2017-03-21 NOTE — Patient Instructions (Addendum)
   Try to incorporate at least 1 serving of vegetables into your diet weekly, and eventually daily. This will provide essential vitamins/ minerals and fiber that will help you stay full and also may help with Crohn's symptoms  Focus on making half of your plate non-starchy vegetables (all other than corn, peas and potatoes), 1/4 of your plate protein, and a 1/4 of your plate starches  Pasta, rice, beans, corn, peas, potatoes, grain, milk, fruit are all sources of carbohydrates  Try substituting your current snacks for fruit  Omega-3s are found in salmon, nuts, olives and olive oil, flaxseeds. They are your healthy, inflammation fighting fats  Look for sources of whole grains (pastas, rice) to include into your weekly diet  Focus on reducing the amount of full fat foods (or at least monitoring portion sizes more closely) in the diet to help with weight loss. This can include cheeses, fried foods, ice cream, salad dressings   Continue to cook with olive oil -great job!  Practice reducing the amount of sugar sweetened beverages (sodas, sweet teas, Gatorade) and replacing them with water or choosing a lower calorie option such as G2 or 1/2 and 1/2 sweet tea  Look for Stevia no calorie sweetener

## 2017-09-13 ENCOUNTER — Emergency Department: Payer: BLUE CROSS/BLUE SHIELD

## 2017-09-13 ENCOUNTER — Other Ambulatory Visit: Payer: Self-pay

## 2017-09-13 ENCOUNTER — Encounter: Payer: Self-pay | Admitting: Emergency Medicine

## 2017-09-13 ENCOUNTER — Emergency Department
Admission: EM | Admit: 2017-09-13 | Discharge: 2017-09-13 | Disposition: A | Discharge status: Home / Self Care | Payer: BLUE CROSS/BLUE SHIELD | Attending: Emergency Medicine | Admitting: Emergency Medicine

## 2017-09-13 DIAGNOSIS — M7551 Bursitis of right shoulder: Secondary | ICD-10-CM | POA: Diagnosis not present

## 2017-09-13 DIAGNOSIS — I10 Essential (primary) hypertension: Secondary | ICD-10-CM | POA: Diagnosis not present

## 2017-09-13 DIAGNOSIS — Z79899 Other long term (current) drug therapy: Secondary | ICD-10-CM | POA: Diagnosis not present

## 2017-09-13 DIAGNOSIS — M25511 Pain in right shoulder: Secondary | ICD-10-CM | POA: Diagnosis present

## 2017-09-13 MED ORDER — BACLOFEN 10 MG PO TABS: 10.0000 mg | ORAL_TABLET | Freq: Every day | ORAL | 1 refills | Status: DC

## 2017-09-13 MED ORDER — PREDNISONE 10 MG (21) PO TBPK: ORAL_TABLET | ORAL | 0 refills | Status: DC

## 2017-09-13 NOTE — ED Notes (Signed)
See triage note  Presents with pain to right shoulder for the past 3 weeks.Marland Kitchendenies any injury  No deformity note  States he has been told he has bone spurs

## 2017-09-13 NOTE — ED Provider Notes (Signed)
[facility] Emergency Department Provider Note  ____________________________________________   First MD Initiated Contact with Patient 09/13/17 0710     (approximate)  I have reviewed the triage vital signs and the nursing notes.   HISTORY  Chief Complaint Shoulder Pain    HPI Spencer Heath is a 52 y.o. male presents emergency department complaining of right shoulder pain for the past 3 weeks.  He states he has had a history of the same.  States he was told he had bone spurs in the shoulder.  He denies any injury at this time.  Denies any numbness or tingling.  He states he does have a muscle relaxer he takes at home but it does make him very sleepy.  Further investigation proves this muscle relaxer is Flexeril.  Patient has not applied ice.  He has a treatment for his Crohn's scheduled for tomorrow.  Past Medical History:  Diagnosis Date  . Arthritis   . Crohn's disease ([place])   . Hypertension     Patient Active Problem List   Diagnosis Date Noted  . Small bowel obstruction ([place]) 01/24/2015  . Crohn's disease ([place]) 01/24/2015    Past Surgical History:  Procedure Laterality Date  . ABDOMINAL SURGERY    . APPENDECTOMY    . testical removed    . TESTICLE SURGERY      Prior to Admission medications   Medication Sig Start Date End Date Taking? Authorizing Provider  amLODipine (NORVASC) 5 MG tablet Take 5 mg by mouth daily at 6 (six) AM. 08/31/14   [provider]  azaTHIOprine (IMURAN) 50 MG tablet Take 50 mg by mouth 4 (four) times daily. 07/21/16   [provider]  baclofen (LIORESAL) 10 MG tablet Take 1 tablet (10 mg total) by mouth daily. 09/13/17 09/13/18  Fisher, Linden Dolin, PA-C  hydrochlorothiazide (HYDRODIURIL) 25 MG tablet Take 25 mg by mouth daily at 6 (six) AM. 07/09/14 08/19/16  [provider]  inFLIXimab (REMICADE) 100 MG injection Inject 100 mg into the vein every 8 (eight) weeks.    [provider]    lisinopril (PRINIVIL,ZESTRIL) 40 MG tablet Take 40 mg by mouth daily at 6 (six) AM. 09/28/14   [provider]  predniSONE (STERAPRED UNI-PAK 21 TAB) 10 MG (21) TBPK tablet Take 6 pills on day one then decrease by 1 pill each day 09/13/17   Versie Starks, PA-C    Allergies Bee venom and Ivp dye [iodinated diagnostic agents]  No family history on file.  Social History Social History   Tobacco Use  . Smoking status: Never Smoker  . Smokeless tobacco: Never Used  Substance Use Topics  . Alcohol use: No  . Drug use: No    Review of Systems  Constitutional: No fever/chills Eyes: No visual changes. ENT: No sore throat. Respiratory: Denies cough Genitourinary: Negative for dysuria. Musculoskeletal: Negative for back pain.  Positive for right shoulder pain Skin: Negative for rash.    ____________________________________________   PHYSICAL EXAM:  VITAL SIGNS: ED Triage Vitals  Enc Vitals Group     BP 09/13/17 0634 133/75     Pulse Rate 09/13/17 0634 69     Resp 09/13/17 0634 18     Temp 09/13/17 0634 98.1 F (36.7 C)     Temp Source 09/13/17 0634 Oral     SpO2 09/13/17 0634 97 %     Weight 09/13/17 0633 220 lb (99.8 kg)     Height 09/13/17 0633 5' 5"  (  1.651 m)     Head Circumference --      Peak Flow --      Pain Score 09/13/17 0632 8     Pain Loc --      Pain Edu? --      Excl. in [place]? --     Constitutional: Alert and oriented. Well appearing and in no acute distress. Eyes: Conjunctivae are normal.  Head: Atraumatic. Nose: No congestion/rhinnorhea. Mouth/Throat: Mucous membranes are moist.   Cardiovascular: Normal rate, regular rhythm.  Heart sounds are normal Respiratory: Normal respiratory effort.  No retractions, lungs are clear to auscultation  GU: deferred Musculoskeletal: FROM all extremities, warm and well perfused.  Pain is reproduced with overhead reach.  The right shoulder is tender at the ACJ.  There is no swelling noted.  There is no  muscle wasting noted.  He is neurovascularly intact Neurologic:  Normal speech and language.  Skin:  Skin is warm, dry and intact. No rash noted. Psychiatric: Mood and affect are normal. Speech and behavior are normal.  ____________________________________________   LABS (all labs ordered are listed, but only abnormal results are displayed)  Labs Reviewed - No data to display ____________________________________________   ____________________________________________  RADIOLOGY  X-ray of the right shoulder is negative for any acute abnormality  ____________________________________________   PROCEDURES  Procedure(s) performed: No  Procedures    ____________________________________________   INITIAL IMPRESSION / ASSESSMENT AND PLAN / ED COURSE  Pertinent labs & imaging results that were available during my care of the patient were reviewed by me and considered in my medical decision making (see chart for details).  Patient is 52 year old male presents emergency department complaining of an episode of right shoulder pain for 3 weeks.  He states he has had similar symptoms previously.  He used his Flexeril which did not really help.  He states he has a history of bone spurs.  On physical exam patient appears well.  He has full range of motion of the shoulder but pain is reproduced with overhead reach.  The rotator cuff does appear to be intact.  He is tender at the ACJ joint  Explained the findings to the patient.  Explained to him these are similar to bursitis.  He was given a prescription for steroid pack and baclofen.  He asked for a work note and this was provided for today.  He was instructed to apply ice to the shoulder instead of heat.  Take medication as prescribed.  And follow-up with his regular doctor for continued evaluation of the shoulder pain.  He states he understands will comply with our instructions.  He was discharged in stable condition     As part of my  medical decision making, I reviewed the following data within the [place] notes reviewed and incorporated, Old chart reviewed, Radiograph reviewed x-ray of the right shoulder is negative, Notes from prior ED visits and [place] Controlled Substance Database  ____________________________________________   FINAL CLINICAL IMPRESSION(S) / ED DIAGNOSES  Final diagnoses:  Acute bursitis of right shoulder      NEW MEDICATIONS STARTED DURING THIS VISIT:  New Prescriptions   BACLOFEN (LIORESAL) 10 MG TABLET    Take 1 tablet (10 mg total) by mouth daily.   PREDNISONE (STERAPRED UNI-PAK 21 TAB) 10 MG (21) TBPK TABLET    Take 6 pills on day one then decrease by 1 pill each day     Note:  This document was prepared using Dragon voice recognition  software and may include unintentional dictation errors.    Versie Starks, PA-C 09/13/17 1962    Earleen Newport, MD 09/13/17 [phone]

## 2017-09-13 NOTE — Discharge Instructions (Addendum)
Follow-up with your regular doctor if not better in 3 to 5 days.  Use medication as prescribed.  Apply ice to the right shoulder.  This will help decrease inflammation.

## 2017-09-13 NOTE — ED Triage Notes (Signed)
Patient ambulatory to triage with steady gait, without difficulty or distress noted; pt reports right shoulder pain with movement x 2 wks with no known injury

## 2017-10-09 ENCOUNTER — Other Ambulatory Visit: Payer: Self-pay | Admitting: Physician Assistant

## 2017-10-09 DIAGNOSIS — N644 Mastodynia: Secondary | ICD-10-CM

## 2017-10-10 ENCOUNTER — Other Ambulatory Visit: Payer: Self-pay | Admitting: Physician Assistant

## 2017-10-10 DIAGNOSIS — N644 Mastodynia: Secondary | ICD-10-CM

## 2017-10-10 DIAGNOSIS — M25511 Pain in right shoulder: Secondary | ICD-10-CM

## 2017-10-15 ENCOUNTER — Other Ambulatory Visit: Payer: Self-pay | Admitting: Physician Assistant

## 2017-10-15 DIAGNOSIS — N644 Mastodynia: Secondary | ICD-10-CM

## 2017-10-25 ENCOUNTER — Ambulatory Visit
Admission: RE | Admit: 2017-10-25 | Discharge: 2017-10-25 | Disposition: A | Discharge status: Home / Self Care | Payer: BLUE CROSS/BLUE SHIELD | Source: Ambulatory Visit | Attending: Physician Assistant | Admitting: Physician Assistant

## 2017-10-25 DIAGNOSIS — M19011 Primary osteoarthritis, right shoulder: Secondary | ICD-10-CM | POA: Insufficient documentation

## 2017-10-25 DIAGNOSIS — M75101 Unspecified rotator cuff tear or rupture of right shoulder, not specified as traumatic: Secondary | ICD-10-CM | POA: Insufficient documentation

## 2017-10-25 DIAGNOSIS — N644 Mastodynia: Secondary | ICD-10-CM

## 2017-10-25 DIAGNOSIS — M25511 Pain in right shoulder: Secondary | ICD-10-CM | POA: Insufficient documentation

## 2017-10-25 DIAGNOSIS — N62 Hypertrophy of breast: Secondary | ICD-10-CM | POA: Diagnosis not present

## 2017-10-25 DIAGNOSIS — M25411 Effusion, right shoulder: Secondary | ICD-10-CM | POA: Insufficient documentation

## 2018-01-27 ENCOUNTER — Other Ambulatory Visit: Payer: Self-pay

## 2018-01-27 ENCOUNTER — Emergency Department
Admission: EM | Admit: 2018-01-27 | Discharge: 2018-01-27 | Disposition: A | Discharge status: Home / Self Care | Payer: BLUE CROSS/BLUE SHIELD | Attending: Emergency Medicine | Admitting: Emergency Medicine

## 2018-01-27 DIAGNOSIS — G43809 Other migraine, not intractable, without status migrainosus: Secondary | ICD-10-CM

## 2018-01-27 DIAGNOSIS — R51 Headache: Secondary | ICD-10-CM | POA: Diagnosis present

## 2018-01-27 DIAGNOSIS — Z79899 Other long term (current) drug therapy: Secondary | ICD-10-CM | POA: Insufficient documentation

## 2018-01-27 DIAGNOSIS — I10 Essential (primary) hypertension: Secondary | ICD-10-CM | POA: Insufficient documentation

## 2018-01-27 MED ORDER — PROCHLORPERAZINE EDISYLATE 10 MG/2ML IJ SOLN
10.0000 mg | Freq: Once | INTRAMUSCULAR | Status: AC
  Administered 2018-01-27: 10 mg via INTRAVENOUS
  Filled 2018-01-27: qty 2

## 2018-01-27 MED ORDER — SODIUM CHLORIDE 0.9 % IV BOLUS
1000.0000 mL | Freq: Once | INTRAVENOUS | Status: AC
  Administered 2018-01-27: 1000 mL via INTRAVENOUS

## 2018-01-27 NOTE — ED Provider Notes (Signed)
[facility] Emergency Department Provider Note   ____________________________________________   I have reviewed the triage vital signs and the nursing notes.   HISTORY  Chief Complaint Headache   History limited by: Not Limited   HPI Spencer Heath is a 52 y.o. male who presents to the emergency department today patient presented to the emergency department today because of concerns for headache.  He states that started couple days ago.  Located primarily in the front of his head.  Has been accompanied by some nausea vomiting.  He states he has a history of migraines and headaches although it is been a while since his last one. He has had some tingling and numbness to his face. Denies any trauma to his face.   Per medical record review patient has a history of HTN.   Past Medical History:  Diagnosis Date  . Arthritis   . Crohn's disease ([place])   . Hypertension     Patient Active Problem List   Diagnosis Date Noted  . Small bowel obstruction ([place]) 01/24/2015  . Crohn's disease ([place]) 01/24/2015    Past Surgical History:  Procedure Laterality Date  . ABDOMINAL SURGERY    . APPENDECTOMY    . testical removed    . TESTICLE SURGERY      Prior to Admission medications   Medication Sig Start Date End Date Taking? Authorizing Provider  amLODipine (NORVASC) 5 MG tablet Take 5 mg by mouth daily at 6 (six) AM. 08/31/14   [provider]  azaTHIOprine (IMURAN) 50 MG tablet Take 50 mg by mouth 4 (four) times daily. 07/21/16   [provider]  baclofen (LIORESAL) 10 MG tablet Take 1 tablet (10 mg total) by mouth daily. 09/13/17 09/13/18  Fisher, Linden Dolin, PA-C  hydrochlorothiazide (HYDRODIURIL) 25 MG tablet Take 25 mg by mouth daily at 6 (six) AM. 07/09/14 08/19/16  [provider]  inFLIXimab (REMICADE) 100 MG injection Inject 100 mg into the vein every 8 (eight) weeks.    [provider]  lisinopril (PRINIVIL,ZESTRIL) 40 MG  tablet Take 40 mg by mouth daily at 6 (six) AM. 09/28/14   [provider]  predniSONE (STERAPRED UNI-PAK 21 TAB) 10 MG (21) TBPK tablet Take 6 pills on day one then decrease by 1 pill each day 09/13/17   Versie Starks, PA-C    Allergies Bee venom and Ivp dye [iodinated diagnostic agents]  Family History  Problem Relation Age of Onset  . Breast cancer Maternal Aunt   . Breast cancer Cousin     Social History Social History   Tobacco Use  . Smoking status: Never Smoker  . Smokeless tobacco: Never Used  Substance Use Topics  . Alcohol use: No  . Drug use: No    Review of Systems Constitutional: No fever/chills Eyes: No visual changes. ENT: No sore throat. Cardiovascular: Denies chest pain. Respiratory: Denies shortness of breath. Gastrointestinal: No abdominal pain.  No nausea, no vomiting.  No diarrhea.   Genitourinary: Negative for dysuria. Musculoskeletal: Negative for back pain. Skin: Negative for rash. Neurological: Positive for headache. ____________________________________________   PHYSICAL EXAM:  VITAL SIGNS: ED Triage Vitals  Enc Vitals Group     BP 01/27/18 1725 126/77     Pulse Rate 01/27/18 1725 70     Resp 01/27/18 1725 20     Temp 01/27/18 1725 98.5 F (36.9 C)     Temp Source 01/27/18 1725 Oral     SpO2 01/27/18 1725 96 %  Weight 01/27/18 1726 225 lb (102.1 kg)     Height 01/27/18 1726 5' 5"  (1.651 m)     Head Circumference --      Peak Flow --      Pain Score 01/27/18 1725 9   Constitutional: Alert and oriented.  Eyes: Conjunctivae are normal.  ENT      Head: Normocephalic and atraumatic.      Nose: No congestion/rhinnorhea.      Mouth/Throat: Mucous membranes are moist.      Neck: No stridor. Hematological/Lymphatic/Immunilogical: No cervical lymphadenopathy. Cardiovascular: Normal rate, regular rhythm.  No murmurs, rubs, or gallops. Respiratory: Normal respiratory effort without tachypnea nor retractions. Breath sounds are  clear and equal bilaterally. No wheezes/rales/rhonchi. Gastrointestinal: Soft and non tender. No rebound. No guarding.  Genitourinary: Deferred Musculoskeletal: Normal range of motion in all extremities. No lower extremity edema. Neurologic:  Normal speech and language. No gross focal neurologic deficits are appreciated.  Skin:  Skin is warm, dry and intact. No rash noted. Psychiatric: Mood and affect are normal. Speech and behavior are normal. Patient exhibits appropriate insight and judgment.  ____________________________________________    LABS (pertinent positives/negatives)  None  ____________________________________________   EKG  None  ____________________________________________    RADIOLOGY  None  ____________________________________________   PROCEDURES  Procedures  ____________________________________________   INITIAL IMPRESSION / ASSESSMENT AND PLAN / ED COURSE  Pertinent labs & imaging results that were available during my care of the patient were reviewed by me and considered in my medical decision making (see chart for details).   Patient presents to the emergency department today because of concern for headache. Patient with history of migraines. Patient given ivfs and medication and felt improved. Discussed importance of pcp follow up.   ____________________________________________   FINAL CLINICAL IMPRESSION(S) / ED DIAGNOSES  Final diagnoses:  Other migraine without status migrainosus, not intractable     Note: This dictation was prepared with Dragon dictation. Any transcriptional errors that result from this process are unintentional     Nance Pear, MD 01/27/18 2008

## 2018-01-27 NOTE — ED Triage Notes (Signed)
Pt reports a headache since Wednesday. Head hurts all over. No hx of headaches typically. Currently on BP medications-lisinopril, HCTZ, amlodipine for a couple years. Has tried ibuprofen and tylenol with minimal relief. Reports numbness around mouth and neck pain with headache. No neuro deficits present.

## 2018-01-27 NOTE — Discharge Instructions (Addendum)
Please seek medical attention for any high fevers, chest pain, shortness of breath, change in behavior, persistent vomiting, bloody stool or any other new or concerning symptoms.  

## 2018-01-27 NOTE — ED Triage Notes (Signed)
First Nurse Note:  C/O headache for past 2-3 days. Describes pain as global.  AAOx3.  Skin warm and dry. NAD.  MAE equally and strong.  Ambulates with easy and steady gait.

## 2018-07-16 ENCOUNTER — Other Ambulatory Visit: Payer: Self-pay

## 2018-07-16 ENCOUNTER — Encounter: Payer: Self-pay | Admitting: Emergency Medicine

## 2018-07-16 ENCOUNTER — Emergency Department
Admission: EM | Admit: 2018-07-16 | Discharge: 2018-07-16 | Disposition: A | Discharge status: Home / Self Care | Payer: BLUE CROSS/BLUE SHIELD | Attending: Emergency Medicine | Admitting: Emergency Medicine

## 2018-07-16 DIAGNOSIS — I10 Essential (primary) hypertension: Secondary | ICD-10-CM | POA: Diagnosis not present

## 2018-07-16 DIAGNOSIS — J209 Acute bronchitis, unspecified: Secondary | ICD-10-CM | POA: Insufficient documentation

## 2018-07-16 DIAGNOSIS — Z79899 Other long term (current) drug therapy: Secondary | ICD-10-CM | POA: Diagnosis not present

## 2018-07-16 DIAGNOSIS — R0981 Nasal congestion: Secondary | ICD-10-CM | POA: Diagnosis present

## 2018-07-16 MED ORDER — AZITHROMYCIN 250 MG PO TABS: ORAL_TABLET | ORAL | 0 refills | Status: AC

## 2018-07-16 MED ORDER — PSEUDOEPH-BROMPHEN-DM 30-2-10 MG/5ML PO SYRP: 5.0000 mL | ORAL_SOLUTION | Freq: Four times a day (QID) | ORAL | 0 refills | Status: DC | PRN

## 2018-07-16 NOTE — ED Notes (Signed)
See triage note   States he developed cough and sinus pressure about 3 weeks ago  No fever and states cough is occasionally prod    States he was seen at urgent care    But he thinks sxs' are getting worse

## 2018-07-16 NOTE — ED Triage Notes (Signed)
Patient ambulatory to triage with steady gait, without difficulty or distress noted, mask in place; pt reports prod cough clear to yellow, congestion, sneezing x 2-3 days

## 2018-07-16 NOTE — ED Provider Notes (Signed)
[facility] Emergency Department Provider Note   ____________________________________________   First MD Initiated Contact with Patient 07/16/18 [phone]     (approximate)  I have reviewed the triage vital signs and the nursing notes.   HISTORY  Chief Complaint Cough    HPI Spencer Heath is a 53 y.o. male patient complains of 1 week of nasal congestion and cough.  Patient stated complaint is worse the last 2 to 3 days.  Patient also states of a productive cough.  Patient denies nausea, vomiting, diarrhea.  Patient denies fever or chills associated with this complaint.  Patient denies pain.  Patient no relief over-the-counter medications.         Past Medical History:  Diagnosis Date  . Arthritis   . Crohn's disease ([place])   . Hypertension     Patient Active Problem List   Diagnosis Date Noted  . Small bowel obstruction ([place]) 01/24/2015  . Crohn's disease ([place]) 01/24/2015    Past Surgical History:  Procedure Laterality Date  . ABDOMINAL SURGERY    . APPENDECTOMY    . testical removed    . TESTICLE SURGERY      Prior to Admission medications   Medication Sig Start Date End Date Taking? Authorizing Provider  amLODipine (NORVASC) 5 MG tablet Take 5 mg by mouth daily at 6 (six) AM. 08/31/14   [provider]  azaTHIOprine (IMURAN) 50 MG tablet Take 50 mg by mouth 4 (four) times daily. 07/21/16   [provider]  azithromycin (ZITHROMAX Z-PAK) 250 MG tablet Take 2 tablets (500 mg) on  Day 1,  followed by 1 tablet (250 mg) once daily on Days 2 through 5. 07/16/18 07/21/18  Sable Feil, PA-C  brompheniramine-pseudoephedrine-DM 30-2-10 MG/5ML syrup Take 5 mLs by mouth 4 (four) times daily as needed. 07/16/18   Sable Feil, PA-C  hydrochlorothiazide (HYDRODIURIL) 25 MG tablet Take 25 mg by mouth daily at 6 (six) AM. 07/09/14 08/19/16  [provider]  inFLIXimab (REMICADE) 100 MG injection Inject 100 mg into the vein every 8  (eight) weeks.    [provider]  lisinopril (PRINIVIL,ZESTRIL) 40 MG tablet Take 40 mg by mouth daily at 6 (six) AM. 09/28/14   [provider]    Allergies Bee venom and Ivp dye [iodinated diagnostic agents]  Family History  Problem Relation Age of Onset  . Breast cancer Maternal Aunt   . Breast cancer Cousin     Social History Social History   Tobacco Use  . Smoking status: Never Smoker  . Smokeless tobacco: Never Used  Substance Use Topics  . Alcohol use: No  . Drug use: No    Review of Systems Constitutional: No fever/chills Eyes: No visual changes. ENT: No sore throat.  Nasal congestion Cardiovascular: Denies chest pain. Respiratory: Denies shortness of breath.  Productive cough Gastrointestinal: No abdominal pain.  No nausea, no vomiting.  No diarrhea.  No constipation. Genitourinary: Negative for dysuria. Musculoskeletal: Negative for back pain. Skin: Negative for rash. Neurological: Negative for headaches, focal weakness or numbness. Endocrine:  Hypertension Hematological/Lymphatic:  Allergic/Immunilogical: Bee sting and IVP dye  ____________________________________________   PHYSICAL EXAM:  VITAL SIGNS: ED Triage Vitals  Enc Vitals Group     BP 07/16/18 0649 (!) 161/88     Pulse Rate 07/16/18 0649 71     Resp 07/16/18 0649 18     Temp 07/16/18 0649 98.2 F (36.8 C)     Temp Source 07/16/18 0649 Oral  SpO2 07/16/18 0649 95 %     Weight 07/16/18 0648 225 lb (102.1 kg)     Height 07/16/18 0648 5' 5"  (1.651 m)     Head Circumference --      Peak Flow --      Pain Score 07/16/18 0649 0     Pain Loc --      Pain Edu? --      Excl. in [place]? --     Constitutional: Alert and oriented. Well appearing and in no acute distress. Nose: Edematous nasal turbinates with thick rhinorrhea.  Mouth/Throat: Mucous membranes are moist.  Oropharynx non-erythematous.  Postnasal drainage. Neck: No stridor. Hematological/Lymphatic/Immunilogical: No  cervical lymphadenopathy. Cardiovascular: Normal rate, regular rhythm. Grossly normal heart sounds.  Good peripheral circulation.  Evaded blood pressure. Respiratory: Normal respiratory effort.  No retractions. Lungs CTAB. Musculoskeletal: No lower extremity tenderness nor edema.  No joint effusions. Neurologic:  Normal speech and language. No gross focal neurologic deficits are appreciated. No gait instability. Skin:  Skin is warm, dry and intact. No rash noted. Psychiatric: Mood and affect are normal. Speech and behavior are normal.  ____________________________________________   LABS (all labs ordered are listed, but only abnormal results are displayed)  Labs Reviewed - No data to display ____________________________________________  EKG   ____________________________________________  RADIOLOGY  ED MD interpretation:    Official radiology report(s): No results found.  ____________________________________________   PROCEDURES  Procedure(s) performed (including Critical Care):  Procedures   ____________________________________________   INITIAL IMPRESSION / ASSESSMENT AND PLAN / ED COURSE  As part of my medical decision making, I reviewed the following data within the [place]         Patient presents with 1 week of nasal congestion and cough.  Physical exam is consistent with bronchitis.  Patient given discharge care instruction advised take medication directed.  Patient will follow PCP.      ____________________________________________   FINAL CLINICAL IMPRESSION(S) / ED DIAGNOSES  Final diagnoses:  Acute bronchitis, unspecified organism     ED Discharge Orders         Ordered    azithromycin (ZITHROMAX Z-PAK) 250 MG tablet     07/16/18 0712    brompheniramine-pseudoephedrine-DM 30-2-10 MG/5ML syrup  4 times daily PRN     07/16/18 0932           Note:  This document was prepared using Dragon voice recognition software  and may include unintentional dictation errors.    Sable Feil, PA-C 07/16/18 6712    Carrie Mew, MD 07/19/18 2142

## 2019-02-14 ENCOUNTER — Emergency Department: Payer: BC Managed Care – PPO

## 2019-02-14 ENCOUNTER — Other Ambulatory Visit: Payer: Self-pay

## 2019-02-14 ENCOUNTER — Emergency Department
Admission: EM | Admit: 2019-02-14 | Discharge: 2019-02-14 | Disposition: A | Discharge status: Home / Self Care | Payer: BC Managed Care – PPO | Attending: Emergency Medicine | Admitting: Emergency Medicine

## 2019-02-14 DIAGNOSIS — M65841 Other synovitis and tenosynovitis, right hand: Secondary | ICD-10-CM | POA: Diagnosis not present

## 2019-02-14 DIAGNOSIS — M659 Synovitis and tenosynovitis, unspecified: Secondary | ICD-10-CM

## 2019-02-14 DIAGNOSIS — Z79899 Other long term (current) drug therapy: Secondary | ICD-10-CM | POA: Diagnosis not present

## 2019-02-14 DIAGNOSIS — I10 Essential (primary) hypertension: Secondary | ICD-10-CM | POA: Insufficient documentation

## 2019-02-14 DIAGNOSIS — M25531 Pain in right wrist: Secondary | ICD-10-CM | POA: Diagnosis present

## 2019-02-14 MED ORDER — PREDNISONE 10 MG PO TABS: ORAL_TABLET | ORAL | 0 refills | Status: DC

## 2019-02-14 NOTE — Discharge Instructions (Signed)
Call and make an appointment with Dr. Roland Rack who is the orthopedist on call if any continued problems or not improving.  Continue wearing your thumb spica splint that you have at home.  Begin taking prednisone beginning with 6 tablets today and tapering down by 1 tablet.

## 2019-02-14 NOTE — ED Provider Notes (Signed)
[facility] Emergency Department Provider Note   ____________________________________________   First MD Initiated Contact with Patient 02/14/19 [phone]     (approximate)  I have reviewed the triage vital signs and the nursing notes.   HISTORY  Chief Complaint Wrist Pain   HPI Spencer Heath is a 53 y.o. male presents to the ED with complaint of right thumb pain extending into his right wrist and partially up his forearm.  He denies any injury.  He was taking naproxen without any relief.  Patient states that he was seen at the walk-in clinic and placed in a splint.  He rates pain as an 8 out of 10.     Past Medical History:  Diagnosis Date  . Arthritis   . Crohn's disease ([place])   . Hypertension     Patient Active Problem List   Diagnosis Date Noted  . Small bowel obstruction ([place]) 01/24/2015  . Crohn's disease ([place]) 01/24/2015    Past Surgical History:  Procedure Laterality Date  . ABDOMINAL SURGERY    . APPENDECTOMY    . testical removed    . TESTICLE SURGERY      Prior to Admission medications   Medication Sig Start Date End Date Taking? Authorizing Provider  amLODipine (NORVASC) 5 MG tablet Take 5 mg by mouth daily at 6 (six) AM. 08/31/14   [provider]  azaTHIOprine (IMURAN) 50 MG tablet Take 50 mg by mouth 4 (four) times daily. 07/21/16   [provider]  hydrochlorothiazide (HYDRODIURIL) 25 MG tablet Take 25 mg by mouth daily at 6 (six) AM. 07/09/14 08/19/16  [provider]  inFLIXimab (REMICADE) 100 MG injection Inject 100 mg into the vein every 8 (eight) weeks.    [provider]  lisinopril (PRINIVIL,ZESTRIL) 40 MG tablet Take 40 mg by mouth daily at 6 (six) AM. 09/28/14   [provider]  predniSONE (DELTASONE) 10 MG tablet Take 6 tablets  today, on day 2 take 5 tablets, day 3 take 4 tablets, day 4 take 3 tablets, day 5 take  2 tablets and 1 tablet the last day 02/14/19   Johnn Hai,  PA-C    Allergies Bee venom and Ivp dye [iodinated diagnostic agents]  Family History  Problem Relation Age of Onset  . Breast cancer Maternal Aunt   . Breast cancer Cousin     Social History Social History   Tobacco Use  . Smoking status: Never Smoker  . Smokeless tobacco: Never Used  Substance Use Topics  . Alcohol use: No  . Drug use: No    Review of Systems Constitutional: No fever/chills Cardiovascular: Denies chest pain. Respiratory: Denies shortness of breath. Gastrointestinal: No abdominal pain.  No nausea, no vomiting.  Musculoskeletal: Positive for right thumb, wrist and mid forearm pain. Skin: Negative for rash. Neurological: Negative for  focal weakness or numbness. ____________________________________________   PHYSICAL EXAM:  VITAL SIGNS: ED Triage Vitals [02/14/19 0651]  Enc Vitals Group     BP 128/64     Pulse Rate (!) 59     Resp 17     Temp 98 F (36.7 C)     Temp src      SpO2 95 %     Weight 230 lb (104.3 kg)     Height 5' 5"  (1.651 m)     Head Circumference      Peak Flow      Pain Score 8     Pain Loc  Pain Edu?      Excl. in [place]?     Constitutional: Alert and oriented. Well appearing and in no acute distress. Eyes: Conjunctivae are normal.  Head: Atraumatic. Neck: No stridor.   Cardiovascular: Normal rate, regular rhythm. Grossly normal heart sounds.  Good peripheral circulation. Respiratory: Normal respiratory effort.  No retractions. Lungs CTAB. Musculoskeletal: On examination of the right thumb there is no gross deformity however there is tenderness on palpation of the base of the thumb and pain is increased with flexion and extension.  Skin is intact.  No erythema or warmth is noted.  Capillary refills less than 3 seconds.  Also patient has discomfort with flexion and extension of his wrist.  No soft tissue edema or discoloration is noted to the distal forearm.  Motor sensory function intact. Neurologic:  Normal speech and  language. No gross focal neurologic deficits are appreciated. Skin:  Skin is warm, dry and intact.  Psychiatric: Mood and affect are normal. Speech and behavior are normal.  ____________________________________________   LABS (all labs ordered are listed, but only abnormal results are displayed)  Labs Reviewed - No data to display  RADIOLOGY  ED MD interpretation:   No acute bony injury noted on the right wrist x-ray.  Official radiology report(s): Dg Wrist Complete Right  Result Date: 02/14/2019 CLINICAL DATA:  Pain EXAM: RIGHT WRIST - COMPLETE 3+ VIEW COMPARISON:  None. FINDINGS: Frontal, oblique, lateral, and ulnar deviation scaphoid images were obtained. There is no appreciable fracture or dislocation. There is slight osteoarthritic change in the first carpal-metacarpal joint. Other joint spaces appear normal. No erosive change. There is soft tissue fullness in the region of the pronator quadratus fat pad, potentially representing effusion in this area. IMPRESSION: Question effusion in the region of the pronator quadratus fat pad. Minimal osteoarthritic change in the first carpal-metacarpal joint. Other joint spaces appear normal. No fracture or dislocation. Electronically Signed   By: Lowella Grip III M.D.   On: 02/14/2019 07:57    ____________________________________________   PROCEDURES  Procedure(s) performed (including Critical Care):  Procedures   ____________________________________________   INITIAL IMPRESSION / ASSESSMENT AND PLAN / ED COURSE  As part of my medical decision making, I reviewed the following data within the electronic MEDICAL RECORD NUMBER Notes from prior ED visits and [place] Controlled Substance Database  53 year old male presents to the ED with complaint of right thumb, wrist and distal forearm pain.  Patient had already been seen at the walk-in clinic and states that the naproxen that he took did not help.  He continues to wear a thumb spica splint.   Patient was given a prescription for prednisone six-day taper.  He is to follow-up with Dr. Roland Rack who is the orthopedist on call if any continued problems.  ____________________________________________   FINAL CLINICAL IMPRESSION(S) / ED DIAGNOSES  Final diagnoses:  Tenosynovitis of thumb     ED Discharge Orders         Ordered    predniSONE (DELTASONE) 10 MG tablet     02/14/19 3295           Note:  This document was prepared using Dragon voice recognition software and may include unintentional dictation errors.    Johnn Hai, PA-C 02/14/19 1106    Earleen Newport, MD 02/14/19 [phone]

## 2019-02-14 NOTE — ED Triage Notes (Signed)
Patient c/o right wrist pain extending from thumb up forearm.

## 2019-03-30 ENCOUNTER — Emergency Department
Admission: EM | Admit: 2019-03-30 | Discharge: 2019-03-30 | Disposition: A | Discharge status: Home / Self Care | Payer: BC Managed Care – PPO | Attending: Emergency Medicine | Admitting: Emergency Medicine

## 2019-03-30 ENCOUNTER — Other Ambulatory Visit: Payer: Self-pay

## 2019-03-30 ENCOUNTER — Emergency Department: Payer: BC Managed Care – PPO

## 2019-03-30 DIAGNOSIS — Z79899 Other long term (current) drug therapy: Secondary | ICD-10-CM | POA: Diagnosis not present

## 2019-03-30 DIAGNOSIS — I10 Essential (primary) hypertension: Secondary | ICD-10-CM | POA: Diagnosis not present

## 2019-03-30 DIAGNOSIS — R319 Hematuria, unspecified: Secondary | ICD-10-CM | POA: Diagnosis present

## 2019-03-30 DIAGNOSIS — R300 Dysuria: Secondary | ICD-10-CM | POA: Insufficient documentation

## 2019-03-30 DIAGNOSIS — M545 Low back pain: Secondary | ICD-10-CM | POA: Insufficient documentation

## 2019-03-30 LAB — URINALYSIS, COMPLETE (UACMP) WITH MICROSCOPIC
Bacteria, UA: NONE SEEN
Bilirubin Urine: NEGATIVE
Glucose, UA: NEGATIVE mg/dL
Ketones, ur: NEGATIVE mg/dL
Leukocytes,Ua: NEGATIVE
Nitrite: NEGATIVE
Protein, ur: NEGATIVE mg/dL
Specific Gravity, Urine: 1.027 (ref 1.005–1.030)
pH: 5 (ref 5.0–8.0)

## 2019-03-30 LAB — BASIC METABOLIC PANEL
Anion gap: 7 (ref 5–15)
BUN: 17 mg/dL (ref 6–20)
CO2: 28 mmol/L (ref 22–32)
Calcium: 9.3 mg/dL (ref 8.9–10.3)
Chloride: 106 mmol/L (ref 98–111)
Creatinine, Ser: 0.99 mg/dL (ref 0.61–1.24)
GFR calc Af Amer: >60 mL/min (ref >60)
GFR calc non Af Amer: >60 mL/min (ref >60)
Glucose, Bld: 117 mg/dL — ABNORMAL HIGH (ref 70–99)
Potassium: 3.5 mmol/L (ref 3.5–5.1)
Sodium: 141 mmol/L (ref 135–145)

## 2019-03-30 LAB — CBC
HCT: 40.3 % (ref 39.0–52.0)
Hemoglobin: 13.3 g/dL (ref 13.0–17.0)
MCH: 27.3 pg (ref 26.0–34.0)
MCHC: 33 g/dL (ref 30.0–36.0)
MCV: 82.8 fL (ref 80.0–100.0)
Platelets: 365 10*3/uL (ref 150–400)
RBC: 4.87 MIL/uL (ref 4.22–5.81)
RDW: 13.8 % (ref 11.5–15.5)
WBC: 5.8 10*3/uL (ref 4.0–10.5)
nRBC: 0 % (ref 0.0–0.2)

## 2019-03-30 MED ORDER — CEPHALEXIN 500 MG PO CAPS: 500.0000 mg | ORAL_CAPSULE | Freq: Three times a day (TID) | ORAL | 0 refills | Status: DC

## 2019-03-30 NOTE — ED Provider Notes (Signed)
[facility] Emergency Department Provider Note  Time seen: 8:28 AM  I have reviewed the triage vital signs and the nursing notes.   HISTORY  Chief Complaint Hematuria   HPI Spencer Heath is a 53 y.o. male with a past medical history of Crohn's disease, hypertension, presents to the emergency department for hematuria.  According to the patient since yesterday he has been experiencing intermittent hematuria.  States is not every time that he urinates but on occasion he will notice blood in his urine.  States he has been experiencing some lower back pain.  Denies any significant flank pain.  No history of kidney stones.  Does state a slight dysuria when he urinates.  No fever.   Past Medical History:  Diagnosis Date  . Arthritis   . Crohn's disease ([place])   . Hypertension     Patient Active Problem List   Diagnosis Date Noted  . Small bowel obstruction ([place]) 01/24/2015  . Crohn's disease ([place]) 01/24/2015    Past Surgical History:  Procedure Laterality Date  . ABDOMINAL SURGERY    . APPENDECTOMY    . testical removed    . TESTICLE SURGERY      Prior to Admission medications   Medication Sig Start Date End Date Taking? Authorizing Provider  amLODipine (NORVASC) 5 MG tablet Take 5 mg by mouth daily at 6 (six) AM. 08/31/14   [provider]  azaTHIOprine (IMURAN) 50 MG tablet Take 50 mg by mouth 4 (four) times daily. 07/21/16   [provider]  hydrochlorothiazide (HYDRODIURIL) 25 MG tablet Take 25 mg by mouth daily at 6 (six) AM. 07/09/14 08/19/16  [provider]  inFLIXimab (REMICADE) 100 MG injection Inject 100 mg into the vein every 8 (eight) weeks.    [provider]  lisinopril (PRINIVIL,ZESTRIL) 40 MG tablet Take 40 mg by mouth daily at 6 (six) AM. 09/28/14   [provider]  predniSONE (DELTASONE) 10 MG tablet Take 6 tablets  today, on day 2 take 5 tablets, day 3 take 4 tablets, day 4 take 3 tablets, day 5  take  2 tablets and 1 tablet the last day 02/14/19   Johnn Hai, PA-C    Allergies  Allergen Reactions  . Bee Venom Hives  . Ivp Dye [Iodinated Diagnostic Agents] Hives    Family History  Problem Relation Age of Onset  . Breast cancer Maternal Aunt   . Breast cancer Cousin     Social History Social History   Tobacco Use  . Smoking status: Never Smoker  . Smokeless tobacco: Never Used  Substance Use Topics  . Alcohol use: No  . Drug use: No    Review of Systems Constitutional: Negative for fever. Cardiovascular: Negative for chest pain. Respiratory: Negative for shortness of breath. Gastrointestinal: Negative for abdominal pain Genitourinary: Intermittent hematuria, mild dysuria Musculoskeletal: Negative for musculoskeletal complaints Neurological: Negative for headache All other ROS negative  ____________________________________________   PHYSICAL EXAM:  VITAL SIGNS: ED Triage Vitals [03/30/19 0710]  Enc Vitals Group     BP 138/78     Pulse Rate (!) 56     Resp 16     Temp 98.5 F (36.9 C)     Temp Source Oral     SpO2 99 %     Weight 230 lb (104.3 kg)     Height 5' 5"  (1.651 m)     Head Circumference      Peak Flow  Pain Score 0     Pain Loc      Pain Edu?      Excl. in [place]?    Constitutional: Alert and oriented. Well appearing and in no distress. Eyes: Normal exam ENT      Head: Normocephalic and atraumatic.      Mouth/Throat: Mucous membranes are moist. Cardiovascular: Normal rate, regular rhythm. Respiratory: Normal respiratory effort without tachypnea nor retractions. Breath sounds are clear Gastrointestinal: Soft and nontender. No distention.  Musculoskeletal: Nontender with normal range of motion in all extremities.  Neurologic:  Normal speech and language. No gross focal neurologic deficits  Skin:  Skin is warm, dry and intact.  Psychiatric: Mood and affect are normal.   ____________________________________________   INITIAL  IMPRESSION / ASSESSMENT AND PLAN / ED COURSE  Pertinent labs & imaging results that were available during my care of the patient were reviewed by me and considered in my medical decision making (see chart for details).   Patient presents to the emergency department for intermittent hematuria over the past 2 days.  Some lower back pain but no flank pain.  Patient's urinalysis shows red cells but no white cells or bacteria.  Blood work is largely nonrevealing.  We will proceed with a CT renal scan to evaluate.  Patient agreeable to plan of care.  CT negative for ureterolithiasis.  We will send urine culture and cover with antibiotics as a precaution.  We will have the patient follow-up with urology.  Spencer Heath was evaluated in Emergency Department on 03/30/2019 for the symptoms described in the history of present illness. He was evaluated in the context of the global COVID-19 pandemic, which necessitated consideration that the patient might be at risk for infection with the SARS-CoV-2 virus that causes COVID-19. Institutional protocols and algorithms that pertain to the evaluation of patients at risk for COVID-19 are in a state of rapid change based on information released by regulatory bodies including the CDC and federal and state organizations. These policies and algorithms were followed during the patient's care in the ED.  ____________________________________________   FINAL CLINICAL IMPRESSION(S) / ED DIAGNOSES  Hematuria   Harvest Dark, MD 03/30/19 1001

## 2019-03-30 NOTE — ED Triage Notes (Signed)
A&O, ambulatory. States hematuria that began yesterday. States painful urination as well. Denies frequency. Denies hx of UTI or kidney stone.

## 2019-03-30 NOTE — ED Notes (Signed)
NAD noted at time of D/C. Pt denies questions or concerns. Pt ambulatory to the lobby at this time.  

## 2019-03-30 NOTE — ED Notes (Signed)
This RN at bedside at this time, EDP at bedside at this time. Pt c/o hematuria that started yesterday. Pt states initially worse then has started to clear up. Pt denies abdominal pain at this time. Pt states "it feels like some dried blood" when asked regarding penile discharge.

## 2019-03-30 NOTE — ED Notes (Signed)
Patient transported to CT 

## 2019-03-31 LAB — URINE CULTURE: Culture: NO GROWTH

## 2019-04-01 ENCOUNTER — Encounter: Payer: Self-pay | Admitting: Occupational Therapy

## 2019-04-01 ENCOUNTER — Ambulatory Visit: Payer: BC Managed Care – PPO | Attending: Sports Medicine | Admitting: Occupational Therapy

## 2019-04-01 ENCOUNTER — Other Ambulatory Visit: Payer: Self-pay

## 2019-04-01 DIAGNOSIS — M25641 Stiffness of right hand, not elsewhere classified: Secondary | ICD-10-CM | POA: Diagnosis present

## 2019-04-01 DIAGNOSIS — M25631 Stiffness of right wrist, not elsewhere classified: Secondary | ICD-10-CM

## 2019-04-01 DIAGNOSIS — M6281 Muscle weakness (generalized): Secondary | ICD-10-CM | POA: Diagnosis present

## 2019-04-01 DIAGNOSIS — M25531 Pain in right wrist: Secondary | ICD-10-CM | POA: Diagnosis not present

## 2019-04-01 NOTE — Patient Instructions (Signed)
Contrast 2-3 x day Pain free AROM for thumb PA and RA  Opposition  Wrist flexion ,ext, RD, UD  10 reps Splint on when using hand - and sleeping  Off for HEP and ADL's

## 2019-04-01 NOTE — Therapy (Signed)
[facility] [address] [place], [address] Phone: [phone]   Fax:  [phone]  Occupational Therapy Evaluation  Patient Details  Name: Spencer Heath MRN: [id number] Date of Birth: 1965-11-14 Referring Provider (OT): Spencer Heath   Encounter Date: 04/01/2019  OT End of Session - 04/01/19 1132    Visit Number  1    Number of Visits  11    Date for OT Re-Evaluation  05/06/19    OT Start Time  0816    OT Stop Time  0930    OT Time Calculation (min)  74 min    Activity Tolerance  Patient tolerated treatment well    Behavior During Therapy  [facility] for tasks assessed/performed       Past Medical History:  Diagnosis Date  . Arthritis   . Crohn's disease ([place])   . Hypertension     Past Surgical History:  Procedure Laterality Date  . ABDOMINAL SURGERY    . APPENDECTOMY    . testical removed    . TESTICLE SURGERY      There were no vitals filed for this visit.  Subjective Assessment - 04/01/19 1109    Subjective   I had shot but it helped only for about day or 2 - wore the splint but that makes it stiff and then it hurts when I take it off - if I sit still like now it do not hurt - but certain movements    Pertinent History  Pt had thumb and wrist pain since going to urgent care on 9/21 - took some medication and splint - then to ER for same pain 10/2, refer to Dr Spencer Heath for shot 02/18/2019 -  but cont to have same pain , splint he feels make pain worse - refer to OT /hand therapy    Patient Stated Goals  Want to pain better so I can do my work , do things around the house - and I do like to play games    Currently in Pain?  Yes    Pain Score  9    tenderness over 1st dorsal compartment   Pain Location  Wrist    Pain Orientation  Right    Pain Descriptors / Indicators  Tender;Burning    Pain Type  Acute pain    Pain Onset  More than a month ago    Pain Frequency  Intermittent    Aggravating  Factors   with using        [facility] OT Assessment - 04/01/19 0001      Assessment   Medical Diagnosis  R DeQuervain tenosynovitis    Referring Provider (OT)  Spencer Heath    Onset Date/Surgical Date  02/03/19    Hand Dominance  Right      Precautions   Required Braces or Orthoses  --   prefab forearm thumbspica - fabricated custom this date      Home  Environment   Lives With  Alone      Prior Function   Vocation  Full time employment    Leisure  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 , like to play video games       AROM   Right Wrist Extension  54 Degrees    Right Wrist Flexion  65 Degrees    Right Wrist Radial Deviation  22 Degrees    Right Wrist Ulnar Deviation  28 Degrees  Left Wrist Extension  78 Degrees    Left Wrist Flexion  80 Degrees    Left Wrist Radial Deviation  20 Degrees    Left Wrist Ulnar Deviation  40 Degrees      Right Hand AROM   R Thumb Radial ABduction/ADduction 0-55  50    R Thumb Palmar ABduction/ADduction 0-45  48    R Thumb Opposition to Index  --   Opposition WNL - slight pull at 5th      fabricate pt forearm base - custom thumb spica   Review with pt and hand out   Contrast 2-3 x day Pain free AROM for thumb PA and RA  Opposition  Wrist flexion ,ext, RD, UD  10 reps Splint on when using hand - and sleeping  Off for HEP and ADL's         OT Treatments/Exercises (OP) - 04/01/19 0001      Iontophoresis   Type of Iontophoresis  Dexamethasone    Location  1st dorsal compartment R    Dose  2.0 current ,med patch     Time  19            OT Education - 04/01/19 1131    Education Details  findings of eval and HEP    Person(s) Educated  Patient    Methods  Explanation;Demonstration;Tactile cues;Verbal cues;Handout    Comprehension  Verbalized understanding;Returned demonstration       OT Short Term Goals - 04/01/19 1139      OT SHORT TERM GOAL #1   Title  Pt to be independent in HEP for  wearing of splint , pain free AROM , and decrease pain    Baseline  pain 7/10 finkelstein , 10/10 with tenderness over distal radius head    Time  3    Period  Weeks    Status  New    Target Date  04/22/19        OT Long Term Goals - 04/01/19 1141      OT LONG TERM GOAL #1   Title  Pain and tenderness over  R 1st dorsal compartment decrease less than 3/10 to wean out of splint    Baseline  tenderness distal radius head 10/10 , Finkelstein 7/10    Time  4    Period  Weeks    Status  New    Target Date  04/29/19      OT LONG TERM GOAL #2   Title  Pt to AROM for R thumb and wrist WNL witn symptoms less than 1-2/10    Baseline  pain with use end of day 7/10 - finkelstein 7/10    Time  5    Period  Weeks    Status  New    Target Date  05/06/19      OT LONG TERM GOAL #3   Title  Function score on PRWHE improve with more than 20 points    Baseline  Function score on PRWHE at eval 33/50    Time  5    Period  Weeks    Status  New    Target Date  05/06/19      OT LONG TERM GOAL #4   Title  R grip and prehension improve to more than 50% compare to L without increase syptoms    Baseline  NT    Time  5    Period  Weeks    Status  New    Target  Date  05/06/19            Plan - 04/01/19 1133    Clinical Impression Statement  Pt present at OT eval with diagosis of R 1st dorsal compartment tenosynovitis - pt tender over distal radius head - 10/10 ; positive Finkelstein 7/10 - and increase AROM with thumb and wrist AROM , pain with use - pt had prefab thumb spica before sicne 8 wks ago - but report it cause more pain wearing it - fabricated this date custome forearm base thumb spica ; pt did had shot about 5 wks ago - with only relieve one day - pt do use hand repetitive at work and hours change to 10 hrs days in about Sept - he also plays video games - pt ed on [employer] , splint wearing and plan to do ionto with dexamethazone - pt can benefit from OT services to decrease pain  and increase ROM and use    OT Occupational Profile and History  Problem Focused Assessment - Including review of records relating to presenting problem    Occupational performance deficits (Please refer to evaluation for details):  ADL's;IADL's;Rest and Sleep;Work;Play;Leisure    Body Structure / Function / Physical Skills  ADL;Flexibility;ROM;UE functional use;IADL;Pain;Edema    Rehab Potential  Fair    Clinical Decision Making  Limited treatment options, no task modification necessary    Modification or Assistance to Complete Evaluation   No modification of tasks or assist necessary to complete eval    OT Frequency  2x / week    OT Duration  --   5 wks   OT Treatment/Interventions  Self-care/ADL training;Therapeutic exercise;Iontophoresis;Contrast Bath;Ultrasound;DME and/or AE instruction;Manual Therapy;Fluidtherapy;Splinting;Patient/family education    Plan  2nd ionto session - assess progress with splint wearing and HEP    OT Home Exercise Plan  see pt instruction    Consulted and Agree with Plan of Care  Patient       Patient will benefit from skilled therapeutic intervention in order to improve the following deficits and impairments:   Body Structure / Function / Physical Skills: ADL, Flexibility, ROM, UE functional use, IADL, Pain, Edema       Visit Diagnosis: Pain in right wrist - Plan: Ot plan of care cert/re-cert  Stiffness of right wrist, not elsewhere classified - Plan: Ot plan of care cert/re-cert  Stiffness of right hand, not elsewhere classified - Plan: Ot plan of care cert/re-cert  Muscle weakness (generalized) - Plan: Ot plan of care cert/re-cert    Problem List Patient Active Problem List   Diagnosis Date Noted  . Small bowel obstruction ([place]) 01/24/2015  . Crohn's disease ([place]) 01/24/2015    Rosalyn Gess OTR/L,CLT 04/01/2019, 12:14 PM  [facility] [address], [place],  [address] Phone: [phone]   Fax:  [phone]  Name: Spencer Heath MRN: [id number] Date of Birth: Dec 04, 1965

## 2019-04-07 ENCOUNTER — Other Ambulatory Visit: Payer: Self-pay

## 2019-04-07 ENCOUNTER — Ambulatory Visit: Payer: BC Managed Care – PPO | Admitting: Occupational Therapy

## 2019-04-07 DIAGNOSIS — M6281 Muscle weakness (generalized): Secondary | ICD-10-CM

## 2019-04-07 DIAGNOSIS — M25531 Pain in right wrist: Secondary | ICD-10-CM

## 2019-04-07 DIAGNOSIS — M25641 Stiffness of right hand, not elsewhere classified: Secondary | ICD-10-CM

## 2019-04-07 DIAGNOSIS — M25631 Stiffness of right wrist, not elsewhere classified: Secondary | ICD-10-CM

## 2019-04-07 NOTE — Therapy (Signed)
[place] [facility] [address], [place], [address] Phone: [phone]   Fax:  [phone]  Occupational Therapy Treatment  Patient Details  Name: Spencer Heath MRN: [id number] Date of Birth: 1965/09/06 Referring Provider (OT): Candelaria Stagers   Encounter Date: 04/07/2019  OT End of Session - 04/07/19 0752    Visit Number  2    Number of Visits  11    Date for OT Re-Evaluation  05/06/19    OT Start Time  0735    OT Stop Time  0830    OT Time Calculation (min)  55 min    Activity Tolerance  Patient tolerated treatment well    Behavior During Therapy  [facility] for tasks assessed/performed       Past Medical History:  Diagnosis Date  . Arthritis   . Crohn's disease ([place])   . Hypertension     Past Surgical History:  Procedure Laterality Date  . ABDOMINAL SURGERY    . APPENDECTOMY    . testical removed    . TESTICLE SURGERY      There were no vitals filed for this visit.  Subjective Assessment - 04/07/19 0752    Subjective   My thumb feels okay in the splint - but when I take it off - it hurts - and still tender    Pertinent History  Pt had thumb and wrist pain since going to urgent care on 9/21 - took some medication and splint - then to ER for same pain 10/2, refer to Dr Candelaria Stagers for shot 02/18/2019 -  but cont to have same pain , splint he feels make pain worse - refer to OT /hand therapy    Patient Stated Goals  Want to pain better so I can do my work , do things around the house - and I do like to play games    Currently in Pain?  Yes    Pain Score  8     Pain Location  Wrist    Pain Orientation  Right    Pain Descriptors / Indicators  Tender;Aching    Pain Type  Acute pain    Pain Onset  More than a month ago       Pt cont to be tender over Distal radius head ,and Positive Finkelstein 8-10/10  Thumb spica assess and pt ed on wearing of splint  And mold skin apply in splint for padding at distal  radius head           skin check done prior to ionto and pt to keep patch on for hour afterwards   OT Treatments/Exercises (OP) - 04/07/19 0001      Iontophoresis   Type of Iontophoresis  Dexamethasone    Location  1st dorsal compartment R    Dose  2.0 current ,med patch     Time  19      RUE Contrast Bath   Time  9 minutes    Comments  at [facility] prior to [facility] and soft tissue       Soft tissue mobs after contrast - graston tool nr 2 sweeping over dorsal and volar forearm  And MC and webspace soft tissue by OT with Thumb RA  Pain free AAROM for wrist flexion , ext, RD, UD  Thumb PA  And RA , opposition to 2nd fold of 5th          OT Education - 04/07/19 4010  Education Details  splint wearing and HEP    Person(s) Educated  Patient    Methods  Explanation;Demonstration;Tactile cues;Verbal cues;Handout    Comprehension  Verbalized understanding;Returned demonstration       OT Short Term Goals - 04/01/19 1139      OT SHORT TERM GOAL #1   Title  Pt to be independent in HEP for wearing of splint , pain free AROM , and decrease pain    Baseline  pain 7/10 finkelstein , 10/10 with tenderness over distal radius head    Time  3    Period  Weeks    Status  New    Target Date  04/22/19        OT Long Term Goals - 04/01/19 1141      OT LONG TERM GOAL #1   Title  Pain and tenderness over  R 1st dorsal compartment decrease less than 3/10 to wean out of splint    Baseline  tenderness distal radius head 10/10 , Finkelstein 7/10    Time  4    Period  Weeks    Status  New    Target Date  04/29/19      OT LONG TERM GOAL #2   Title  Pt to AROM for R thumb and wrist WNL witn symptoms less than 1-2/10    Baseline  pain with use end of day 7/10 - finkelstein 7/10    Time  5    Period  Weeks    Status  New    Target Date  05/06/19      OT LONG TERM GOAL #3   Title  Function score on PRWHE improve with more than 20 points    Baseline  Function score on PRWHE at  eval 33/50    Time  5    Period  Weeks    Status  New    Target Date  05/06/19      OT LONG TERM GOAL #4   Title  R grip and prehension improve to more than 50% compare to L without increase syptoms    Baseline  NT    Time  5    Period  Weeks    Status  New    Target Date  05/06/19            Plan - 04/07/19 0753    Clinical Impression Statement  Pt report no pain in thumb spice - but still 8/10 or more with tenderness and Finkelstein - ed pt again on anatomy and injury - splint wearing and pain free AROM for thumb and wrist to maintain flexibilty but decrease pain    OT Occupational Profile and History  Problem Focused Assessment - Including review of records relating to presenting problem    Occupational performance deficits (Please refer to evaluation for details):  ADL's;IADL's;Rest and Sleep;Work;Play;Leisure    Body Structure / Function / Physical Skills  ADL;Flexibility;ROM;UE functional use;IADL;Pain;Edema    Rehab Potential  Fair    Clinical Decision Making  Limited treatment options, no task modification necessary    Modification or Assistance to Complete Evaluation   No modification of tasks or assist necessary to complete eval    OT Frequency  2x / week    OT Duration  4 weeks    OT Treatment/Interventions  Self-care/ADL training;Therapeutic exercise;Iontophoresis;Contrast Bath;Ultrasound;DME and/or AE instruction;Manual Therapy;Fluidtherapy;Splinting;Patient/family education    Plan  3rd  ionto session - assess progress with splint wearing and HEP    OT  Home Exercise Plan  see pt instruction    Consulted and Agree with Plan of Care  Patient       Patient will benefit from skilled therapeutic intervention in order to improve the following deficits and impairments:   Body Structure / Function / Physical Skills: ADL, Flexibility, ROM, UE functional use, IADL, Pain, Edema       Visit Diagnosis: Pain in right wrist  Stiffness of right wrist, not elsewhere  classified  Stiffness of right hand, not elsewhere classified  Muscle weakness (generalized)    Problem List Patient Active Problem List   Diagnosis Date Noted  . Small bowel obstruction ([place]) 01/24/2015  . Crohn's disease ([place]) 01/24/2015    Rosalyn Gess OTR/l,CLT 04/07/2019, 8:37 AM  [place] [facility] [address], [place], [address] Phone: [phone]   Fax:  [phone]  Name: RISHAWN WALCK MRN: [id number] Date of Birth: [birth year]

## 2019-04-07 NOTE — Patient Instructions (Signed)
Same - Splint on at all times  Pain free AROM for thumb and wrist  After doing contrast

## 2019-04-14 ENCOUNTER — Ambulatory Visit: Payer: BC Managed Care – PPO | Admitting: Occupational Therapy

## 2019-04-14 ENCOUNTER — Other Ambulatory Visit: Payer: Self-pay

## 2019-04-14 DIAGNOSIS — M25631 Stiffness of right wrist, not elsewhere classified: Secondary | ICD-10-CM

## 2019-04-14 DIAGNOSIS — M25531 Pain in right wrist: Secondary | ICD-10-CM | POA: Diagnosis not present

## 2019-04-14 DIAGNOSIS — M25641 Stiffness of right hand, not elsewhere classified: Secondary | ICD-10-CM

## 2019-04-14 DIAGNOSIS — M6281 Muscle weakness (generalized): Secondary | ICD-10-CM

## 2019-04-14 NOTE — Therapy (Signed)
[place] [facility] [address], [place], [address] Phone: [phone]   Fax:  [phone]  Occupational Therapy Treatment  Patient Details  Name: Spencer Heath MRN: [id number] Date of Birth: [birth year] Referring Provider (OT): Candelaria Stagers   Encounter Date: 04/14/2019  OT End of Session - 04/14/19 0800    Visit Number  3    Number of Visits  11    Date for OT Re-Evaluation  05/06/19    OT Start Time  0747    OT Stop Time  0844    OT Time Calculation (min)  57 min    Activity Tolerance  Patient tolerated treatment well    Behavior During Therapy  [facility] for tasks assessed/performed       Past Medical History:  Diagnosis Date  . Arthritis   . Crohn's disease ([place])   . Hypertension     Past Surgical History:  Procedure Laterality Date  . ABDOMINAL SURGERY    . APPENDECTOMY    . testical removed    . TESTICLE SURGERY      There were no vitals filed for this visit.  Subjective Assessment - 04/14/19 0800    Subjective   I don't have pain when in the splint - and more motion in my thumb with less pain - but when I take my splint off - especcially in the morning - it hurts overt the back of my wrist - and pain when moving    Pertinent History  Pt had thumb and wrist pain since going to urgent care on 9/21 - took some medication and splint - then to ER for same pain 10/2, refer to Dr Candelaria Stagers for shot 02/18/2019 -  but cont to have same pain , splint he feels make pain worse - refer to OT /hand therapy    Patient Stated Goals  Want to pain better so I can do my work , do things around the house - and I do like to play games    Currently in Pain?  Yes    Pain Score  8    but Wynn Maudlin 2//10   Pain Location  Wrist    Pain Orientation  Right    Pain Descriptors / Indicators  Tender;Aching;Burning    Pain Type  Acute pain    Pain Onset  More than a month ago         [facility] OT Assessment - 04/14/19 0001      AROM   Right Wrist Extension  74 Degrees    Right Wrist Flexion  85 Degrees    Right Wrist Radial Deviation  20 Degrees    Right Wrist Ulnar Deviation  40 Degrees      Right Hand AROM   R Thumb Radial ABduction/ADduction 0-55  50    R Thumb Palmar ABduction/ADduction 0-45  54    R Thumb Opposition to Index  --   no pull to base of 5th      Measurement taken - great progress in AROM with less pain  Pt do report still tenderness over 1st dorsal compartment And burning pain over dorsal wrist Stiffness - mostly in the am when getting up and taking splint off  Tenderness still 8/10  But Finkelstein decrease to 2/10 And less pain with wrist and thumb AROM    Did assess and ed pt to no pull splint to tight over dorsal and radial wrist  skin check done prior to ionto and pt to keep patch on for hour afterwards  OT Treatments/Exercises (OP) - 04/14/19 0001      Iontophoresis   Type of Iontophoresis  Dexamethasone    Location  1st dorsal compartment R    Dose  2.0 current ,med patch     Time  19      RUE Contrast Bath   Time  9 minutes    Comments  At [facility] prior to soft tissue         Soft tissue mobs after contrast - graston tool nr 2 sweeping over dorsal and volar forearm  And MC and webspace soft tissue by OT with Thumb RA  Pain free AAROM for wrist flexion , ext, RD, UD  Thumb PA  And RA , opposition to base of 5th       OT Education - 04/14/19 0800    Education Details  splint wearing and HEP    Person(s) Educated  Patient    Methods  Explanation;Demonstration;Tactile cues;Verbal cues;Handout    Comprehension  Verbalized understanding;Returned demonstration       OT Short Term Goals - 04/01/19 1139      OT SHORT TERM GOAL #1   Title  Pt to be independent in HEP for wearing of splint , pain free AROM , and decrease pain    Baseline  pain 7/10 finkelstein , 10/10 with tenderness over distal radius head    Time  3    Period  Weeks    Status  New     Target Date  04/22/19        OT Long Term Goals - 04/01/19 1141      OT LONG TERM GOAL #1   Title  Pain and tenderness over  R 1st dorsal compartment decrease less than 3/10 to wean out of splint    Baseline  tenderness distal radius head 10/10 , Finkelstein 7/10    Time  4    Period  Weeks    Status  New    Target Date  04/29/19      OT LONG TERM GOAL #2   Title  Pt to AROM for R thumb and wrist WNL witn symptoms less than 1-2/10    Baseline  pain with use end of day 7/10 - finkelstein 7/10    Time  5    Period  Weeks    Status  New    Target Date  05/06/19      OT LONG TERM GOAL #3   Title  Function score on PRWHE improve with more than 20 points    Baseline  Function score on PRWHE at eval 33/50    Time  5    Period  Weeks    Status  New    Target Date  05/06/19      OT LONG TERM GOAL #4   Title  R grip and prehension improve to more than 50% compare to L without increase syptoms    Baseline  NT    Time  5    Period  Weeks    Status  New    Target Date  05/06/19            Plan - 04/14/19 2863    Clinical Impression Statement  Pt report less pain in splint and more motion at thumb and wrist - with less pain - after wearing splint for night time and long time - burning pain  over dorsal wrist and distal radius - cont to be tender - but less pain with Wynn Maudlin - cont with ionto - last 2 was once a week - will do 2 x this and next week    OT Occupational Profile and History  Problem Focused Assessment - Including review of records relating to presenting problem    Occupational performance deficits (Please refer to evaluation for details):  ADL's;IADL's;Rest and Sleep;Work;Play;Leisure    Body Structure / Function / Physical Skills  ADL;Flexibility;ROM;UE functional use;IADL;Pain;Edema    Rehab Potential  Fair    Clinical Decision Making  Limited treatment options, no task modification necessary    Modification or Assistance to Complete Evaluation   No  modification of tasks or assist necessary to complete eval    OT Frequency  2x / week    OT Duration  4 weeks    OT Treatment/Interventions  Self-care/ADL training;Therapeutic exercise;Iontophoresis;Contrast Bath;Ultrasound;DME and/or AE instruction;Manual Therapy;Fluidtherapy;Splinting;Patient/family education    Plan  4th  ionto session - assess progress with splint wearing and HEP    OT Home Exercise Plan  see pt instruction    Consulted and Agree with Plan of Care  Patient       Patient will benefit from skilled therapeutic intervention in order to improve the following deficits and impairments:   Body Structure / Function / Physical Skills: ADL, Flexibility, ROM, UE functional use, IADL, Pain, Edema       Visit Diagnosis: Stiffness of right wrist, not elsewhere classified  Stiffness of right hand, not elsewhere classified  Muscle weakness (generalized)  Pain in right wrist    Problem List Patient Active Problem List   Diagnosis Date Noted  . Small bowel obstruction ([place]) 01/24/2015  . Crohn's disease ([place]) 01/24/2015    Rosalyn Gess OTR/L,CLT 04/14/2019, 9:14 AM  [place] [facility] [address] [place], [address] Phone: [phone]   Fax:  [phone]  Name: Spencer Heath MRN: [id number] Date of Birth: April 15, 1966

## 2019-04-14 NOTE — Patient Instructions (Signed)
Cont with same HEP -but needs to get heat and ice that wraps around the wrist -no just of radial wrist

## 2019-04-15 ENCOUNTER — Emergency Department
Admission: EM | Admit: 2019-04-15 | Discharge: 2019-04-15 | Disposition: A | Discharge status: Home / Self Care | Payer: BC Managed Care – PPO | Attending: Student in an Organized Health Care Education/Training Program | Admitting: Student in an Organized Health Care Education/Training Program

## 2019-04-15 ENCOUNTER — Other Ambulatory Visit: Payer: Self-pay

## 2019-04-15 ENCOUNTER — Emergency Department: Payer: BC Managed Care – PPO

## 2019-04-15 ENCOUNTER — Encounter: Payer: Self-pay | Admitting: *Deleted

## 2019-04-15 DIAGNOSIS — Z79899 Other long term (current) drug therapy: Secondary | ICD-10-CM | POA: Diagnosis not present

## 2019-04-15 DIAGNOSIS — I10 Essential (primary) hypertension: Secondary | ICD-10-CM | POA: Diagnosis not present

## 2019-04-15 DIAGNOSIS — R079 Chest pain, unspecified: Secondary | ICD-10-CM | POA: Diagnosis present

## 2019-04-15 DIAGNOSIS — R0789 Other chest pain: Secondary | ICD-10-CM | POA: Diagnosis not present

## 2019-04-15 HISTORY — DX: Gastro-esophageal reflux disease without esophagitis: K21.9

## 2019-04-15 LAB — CBC
HCT: 38.4 % — ABNORMAL LOW (ref 39.0–52.0)
Hemoglobin: 13.3 g/dL (ref 13.0–17.0)
MCH: 27.7 pg (ref 26.0–34.0)
MCHC: 34.6 g/dL (ref 30.0–36.0)
MCV: 79.8 fL — ABNORMAL LOW (ref 80.0–100.0)
Platelets: 307 10*3/uL (ref 150–400)
RBC: 4.81 MIL/uL (ref 4.22–5.81)
RDW: 14 % (ref 11.5–15.5)
WBC: 8 10*3/uL (ref 4.0–10.5)
nRBC: 0 % (ref 0.0–0.2)

## 2019-04-15 LAB — BASIC METABOLIC PANEL
Anion gap: 9 (ref 5–15)
BUN: 20 mg/dL (ref 6–20)
CO2: 28 mmol/L (ref 22–32)
Calcium: 9.5 mg/dL (ref 8.9–10.3)
Chloride: 102 mmol/L (ref 98–111)
Creatinine, Ser: 1.03 mg/dL (ref 0.61–1.24)
GFR calc Af Amer: >60 mL/min (ref >60)
GFR calc non Af Amer: >60 mL/min (ref >60)
Glucose, Bld: 112 mg/dL — ABNORMAL HIGH (ref 70–99)
Potassium: 3.3 mmol/L — ABNORMAL LOW (ref 3.5–5.1)
Sodium: 139 mmol/L (ref 135–145)

## 2019-04-15 LAB — TROPONIN I (HIGH SENSITIVITY)
Troponin I (High Sensitivity): 4 ng/L (ref <18)
Troponin I (High Sensitivity): 4 ng/L (ref <18)

## 2019-04-15 MED ORDER — CYCLOBENZAPRINE HCL 5 MG PO TABS: 5.0000 mg | ORAL_TABLET | Freq: Three times a day (TID) | ORAL | 0 refills | Status: DC | PRN

## 2019-04-15 NOTE — ED Provider Notes (Signed)
[facility] Emergency Department Provider Note    First MD Initiated Contact with Patient 04/15/19 1645     (approximate)  I have reviewed the triage vital signs and the nursing notes.   HISTORY  Chief Complaint Chest Pain    HPI Spencer Heath is a 53 y.o. male plosive past medical history presents the ER for 2 episodes of brief sharp right-sided chest pain without radiation.  No associated shortness of breath.  No diaphoresis.  States that symptoms lasted only a few seconds.  Occurred while he was working.  States has had similar episodes in the past.  Not changed with position.  Symptoms slightly more severe than previous.  Denies any abdominal pain nausea or vomiting.  No fevers.  No cough.  No lower extremity swelling.    Past Medical History:  Diagnosis Date  . Arthritis   . Crohn's disease ([place])   . GERD (gastroesophageal reflux disease)   . Hypertension    Family History  Problem Relation Age of Onset  . Breast cancer Maternal Aunt   . Breast cancer Cousin    Past Surgical History:  Procedure Laterality Date  . ABDOMINAL SURGERY    . APPENDECTOMY    . testical removed    . TESTICLE SURGERY     Patient Active Problem List   Diagnosis Date Noted  . Small bowel obstruction ([place]) 01/24/2015  . Crohn's disease ([place]) 01/24/2015      Prior to Admission medications   Medication Sig Start Date End Date Taking? Authorizing Provider  amLODipine (NORVASC) 5 MG tablet Take 5 mg by mouth daily at 6 (six) AM. 08/31/14   [provider]  azaTHIOprine (IMURAN) 50 MG tablet Take 50 mg by mouth 4 (four) times daily. 07/21/16   [provider]  cephALEXin (KEFLEX) 500 MG capsule Take 1 capsule (500 mg total) by mouth 3 (three) times daily. 03/30/19   Harvest Dark, MD  hydrochlorothiazide (HYDRODIURIL) 25 MG tablet Take 25 mg by mouth daily at 6 (six) AM. 07/09/14 08/19/16  [provider]  inFLIXimab (REMICADE) 100 MG  injection Inject 100 mg into the vein every 8 (eight) weeks.    [provider]  lisinopril (PRINIVIL,ZESTRIL) 40 MG tablet Take 40 mg by mouth daily at 6 (six) AM. 09/28/14   [provider]  predniSONE (DELTASONE) 10 MG tablet Take 6 tablets  today, on day 2 take 5 tablets, day 3 take 4 tablets, day 4 take 3 tablets, day 5 take  2 tablets and 1 tablet the last day 02/14/19   Johnn Hai, PA-C    Allergies Bee venom and Ivp dye [iodinated diagnostic agents]    Social History Social History   Tobacco Use  . Smoking status: Never Smoker  . Smokeless tobacco: Never Used  Substance Use Topics  . Alcohol use: No  . Drug use: No    Review of Systems Patient denies headaches, rhinorrhea, blurry vision, numbness, shortness of breath, chest pain, edema, cough, abdominal pain, nausea, vomiting, diarrhea, dysuria, fevers, rashes or hallucinations unless otherwise stated above in HPI. ____________________________________________   PHYSICAL EXAM:  VITAL SIGNS: Vitals:   04/15/19 1337  BP: 121/76  Pulse: 74  Resp: 18  Temp: 98.2 F (36.8 C)  SpO2: 97%    Constitutional: Alert and oriented.  Eyes: Conjunctivae are normal.  Head: Atraumatic. Nose: No congestion/rhinnorhea. Mouth/Throat: Mucous membranes are moist.   Neck: No stridor. Painless ROM.  Cardiovascular: Normal rate, regular rhythm.  Grossly normal heart sounds.  Good peripheral circulation. Respiratory: Normal respiratory effort.  No retractions. Lungs CTAB. Gastrointestinal: Soft and nontender. No distention. No abdominal bruits. No CVA tenderness. Genitourinary:  Musculoskeletal: No lower extremity tenderness nor edema.  No joint effusions. Neurologic:  Normal speech and language. No gross focal neurologic deficits are appreciated. No facial droop Skin:  Skin is warm, dry and intact. No rash noted. Psychiatric: Mood and affect are normal. Speech and behavior are normal.   ____________________________________________   LABS (all labs ordered are listed, but only abnormal results are displayed)  Results for orders placed or performed during the hospital encounter of 04/15/19 (from the past 24 hour(s))  Basic metabolic panel     Status: Abnormal   Collection Time: 04/15/19  1:47 PM  Result Value Ref Range   Sodium 139 135 - 145 mmol/L   Potassium 3.3 (L) 3.5 - 5.1 mmol/L   Chloride 102 98 - 111 mmol/L   CO2 28 22 - 32 mmol/L   Glucose, Bld 112 (H) 70 - 99 mg/dL   BUN 20 6 - 20 mg/dL   Creatinine, Ser 1.03 0.61 - 1.24 mg/dL   Calcium 9.5 8.9 - 10.3 mg/dL   GFR calc non Af Amer >60 >60 mL/min   GFR calc Af Amer >60 >60 mL/min   Anion gap 9 5 - 15  CBC     Status: Abnormal   Collection Time: 04/15/19  1:47 PM  Result Value Ref Range   WBC 8.0 4.0 - 10.5 K/uL   RBC 4.81 4.22 - 5.81 MIL/uL   Hemoglobin 13.3 13.0 - 17.0 g/dL   HCT 38.4 (L) 39.0 - 52.0 %   MCV 79.8 (L) 80.0 - 100.0 fL   MCH 27.7 26.0 - 34.0 pg   MCHC 34.6 30.0 - 36.0 g/dL   RDW 14.0 11.5 - 15.5 %   Platelets 307 150 - 400 K/uL   nRBC 0.0 0.0 - 0.2 %  Troponin I (High Sensitivity)     Status: None   Collection Time: 04/15/19  1:47 PM  Result Value Ref Range   Troponin I (High Sensitivity) 4 <18 ng/L  Troponin I (High Sensitivity)     Status: None   Collection Time: 04/15/19  3:33 PM  Result Value Ref Range   Troponin I (High Sensitivity) 4 <18 ng/L   ____________________________________________  EKG My review and personal interpretation at Time: 13:30   Indication: chest pain  Rate: 70  Rhythm: sinus Axis: normal Other: nonspecific st abn, no stemi or depressions, unchanged as compared to previous tracing ____________________________________________  RADIOLOGY  I personally reviewed all radiographic images ordered to evaluate for the above acute complaints and reviewed radiology reports and findings.  These findings were personally discussed with the patient.  Please see  medical record for radiology report.  ____________________________________________   PROCEDURES  Procedure(s) performed:  Procedures    Critical Care performed: no ____________________________________________   INITIAL IMPRESSION / ASSESSMENT AND PLAN / ED COURSE  Pertinent labs & imaging results that were available during my care of the patient were reviewed by me and considered in my medical decision making (see chart for details).   DDX: msk strain, ptx, pna, pericarditis, acs, pe, dissection  Spencer Heath is a 53 y.o. who presents to the ED with symptoms as described above.  Patient well-appearing in no acute distress.  No hypertension.  No chest pain ripping or tearing through to his back.  Does not seem clinically consistent with  dissection he is otherwise well-appearing without any neuro deficits.  Is low risk by heart score of 3.  EKG is unchanged as compared to previous with 2 - high-sensitivity troponins.  No evidence of pneumothorax.  No evidence of pneumonia.  Does not seem consistent with PE given lack of tachycardia or hypoxia.  Suspect musculoskeletal strain.  Possible costochondritis.  Stable appropriate for outpatient follow-up.     The patient was evaluated in Emergency Department today for the symptoms described in the history of present illness. He/she was evaluated in the context of the global COVID-19 pandemic, which necessitated consideration that the patient might be at risk for infection with the SARS-CoV-2 virus that causes COVID-19. Institutional protocols and algorithms that pertain to the evaluation of patients at risk for COVID-19 are in a state of rapid change based on information released by regulatory bodies including the CDC and federal and state organizations. These policies and algorithms were followed during the patient's care in the ED.  As part of my medical decision making, I reviewed the following data within the [place] notes reviewed and incorporated, Labs reviewed, notes from prior ED visits and [place] Controlled Substance Database   ____________________________________________   FINAL CLINICAL IMPRESSION(S) / ED DIAGNOSES  Final diagnoses:  Atypical chest pain      NEW MEDICATIONS STARTED DURING THIS VISIT:  New Prescriptions   No medications on file     Note:  This document was prepared using Dragon voice recognition software and may include unintentional dictation errors.    Merlyn Lot, MD 04/15/19 [phone]

## 2019-04-15 NOTE — ED Triage Notes (Signed)
Patient states he had two episodes of sharp, right-side and mid-sternal chest pain at work today. Patient denies other symptoms.

## 2019-04-17 ENCOUNTER — Other Ambulatory Visit: Payer: Self-pay

## 2019-04-17 ENCOUNTER — Ambulatory Visit: Payer: BC Managed Care – PPO | Attending: Sports Medicine | Admitting: Occupational Therapy

## 2019-04-17 DIAGNOSIS — M25641 Stiffness of right hand, not elsewhere classified: Secondary | ICD-10-CM | POA: Diagnosis present

## 2019-04-17 DIAGNOSIS — M25631 Stiffness of right wrist, not elsewhere classified: Secondary | ICD-10-CM | POA: Diagnosis present

## 2019-04-17 DIAGNOSIS — M25531 Pain in right wrist: Secondary | ICD-10-CM | POA: Diagnosis present

## 2019-04-17 DIAGNOSIS — M6281 Muscle weakness (generalized): Secondary | ICD-10-CM | POA: Insufficient documentation

## 2019-04-17 NOTE — Therapy (Signed)
[facility] [address] [place], [address] Phone: [phone]   Fax:  [phone]  Occupational Therapy Treatment  Patient Details  Name: Spencer Heath MRN: [id number] Date of Birth: [birth year] Referring Provider (OT): Candelaria Stagers   Encounter Date: 04/17/2019  OT End of Session - 04/17/19 0822    Visit Number  4    Number of Visits  11    Date for OT Re-Evaluation  05/06/19    OT Start Time  0735    OT Stop Time  0832    OT Time Calculation (min)  57 min    Activity Tolerance  Patient tolerated treatment well    Behavior During Therapy  [facility] for tasks assessed/performed       Past Medical History:  Diagnosis Date  . Arthritis   . Crohn's disease ([place])   . GERD (gastroesophageal reflux disease)   . Hypertension     Past Surgical History:  Procedure Laterality Date  . ABDOMINAL SURGERY    . APPENDECTOMY    . testical removed    . TESTICLE SURGERY      There were no vitals filed for this visit.  Subjective Assessment - 04/17/19 0818    Subjective   Doing better- but still at times feel little shooting pain , and tenderness - but better than what it was - and less pain when moving my thumb and wrist    Pertinent History  Pt had thumb and wrist pain since going to urgent care on 9/21 - took some medication and splint - then to ER for same pain 10/2, refer to Dr Candelaria Stagers for shot 02/18/2019 -  but cont to have same pain , splint he feels make pain worse - refer to OT /hand therapy    Patient Stated Goals  Want to pain better so I can do my work , do things around the house - and I do like to play games    Currently in Pain?  Yes    Pain Score  7    No pull with Finkelstein   Pain Location  --   distal radius head   Pain Orientation  Right    Pain Descriptors / Indicators  Tender    Pain Type  Acute pain    Pain Onset  More than a month ago    Aggravating Factors   tender when palpation          [facility] OT Assessment - 04/17/19 0001      Strength   Right Hand Grip (lbs)  60    Right Hand Lateral Pinch  18 lbs    Right Hand 3 Point Pinch  12 lbs    Left Hand Grip (lbs)  75    Left Hand Lateral Pinch  18 lbs    Left Hand 3 Point Pinch  13 lbs      AROM for wrist and thumb WFL  And less pain  Isometric thumb ADD no pain  Isometric RA of thumb 1/10 Opposition and RD with thumb static - no pain  And WFL  Pain less than 1-2/10 with grip and prehension assessment        skin check done prior to ionto - no issues  - and pt to keep patch on for hour afterwards   OT Treatments/Exercises (OP) - 04/17/19 0001      Iontophoresis   Type of Iontophoresis  Dexamethasone    Location  1st dorsal compartment R    Dose  2.0 current ,med patch     Time  19      RUE Contrast Bath   Time  9 minutes    Comments  SOC prior to soft tissue      Soft tissue mobs after contrast - graston tool nr 2 sweeping over dorsal and volar forearm  And MC and webspace soft tissue by OT with Thumb RA  Pain free AAROM for wrist flexion , ext, RD, UD  Thumb PA And RA , opposition to base of 5th         OT Education - 04/17/19 0822    Education Details  progress, HEP and splint wearing    Person(s) Educated  Patient    Methods  Explanation;Demonstration;Tactile cues;Verbal cues;Handout    Comprehension  Verbalized understanding;Returned demonstration       OT Short Term Goals - 04/01/19 1139      OT SHORT TERM GOAL #1   Title  Pt to be independent in HEP for wearing of splint , pain free AROM , and decrease pain    Baseline  pain 7/10 finkelstein , 10/10 with tenderness over distal radius head    Time  3    Period  Weeks    Status  New    Target Date  04/22/19        OT Long Term Goals - 04/01/19 1141      OT LONG TERM GOAL #1   Title  Pain and tenderness over  R 1st dorsal compartment decrease less than 3/10 to wean out of splint    Baseline  tenderness distal radius  head 10/10 , Finkelstein 7/10    Time  4    Period  Weeks    Status  New    Target Date  04/29/19      OT LONG TERM GOAL #2   Title  Pt to AROM for R thumb and wrist WNL witn symptoms less than 1-2/10    Baseline  pain with use end of day 7/10 - finkelstein 7/10    Time  5    Period  Weeks    Status  New    Target Date  05/06/19      OT LONG TERM GOAL #3   Title  Function score on PRWHE improve with more than 20 points    Baseline  Function score on PRWHE at eval 33/50    Time  5    Period  Weeks    Status  New    Target Date  05/06/19      OT LONG TERM GOAL #4   Title  R grip and prehension improve to more than 50% compare to L without increase syptoms    Baseline  NT    Time  5    Period  Weeks    Status  New    Target Date  05/06/19            Plan - 04/17/19 4540    Clinical Impression Statement  Pt making progress with less pain with AROM for wrist and thumb , Wynn Maudlin - but still tender over distal radius - and pain with certain suddden movements - cont with ionto - this was first week he could do ionto 2 x wk    OT Occupational Profile and History  Problem Focused Assessment - Including review of records relating to presenting problem    Occupational performance deficits (Please  refer to evaluation for details):  ADL's;IADL's;Rest and Sleep;Work;Play;Leisure    Body Structure / Function / Physical Skills  ADL;Flexibility;ROM;UE functional use;IADL;Pain;Edema    Rehab Potential  Fair    Clinical Decision Making  Limited treatment options, no task modification necessary    Comorbidities Affecting Occupational Performance:  None    Modification or Assistance to Complete Evaluation   No modification of tasks or assist necessary to complete eval    OT Frequency  2x / week    OT Duration  4 weeks    OT Treatment/Interventions  Self-care/ADL training;Therapeutic exercise;Iontophoresis;Contrast Bath;Ultrasound;DME and/or AE instruction;Manual  Therapy;Fluidtherapy;Splinting;Patient/family education    Plan  5th  ionto session next week - assess progress with splint wearing and HEP    OT Home Exercise Plan  see pt instruction    Consulted and Agree with Plan of Care  Patient       Patient will benefit from skilled therapeutic intervention in order to improve the following deficits and impairments:   Body Structure / Function / Physical Skills: ADL, Flexibility, ROM, UE functional use, IADL, Pain, Edema       Visit Diagnosis: Stiffness of right wrist, not elsewhere classified  Stiffness of right hand, not elsewhere classified  Muscle weakness (generalized)  Pain in right wrist    Problem List Patient Active Problem List   Diagnosis Date Noted  . Small bowel obstruction ([place]) 01/24/2015  . Crohn's disease ([place]) 01/24/2015    Rosalyn Gess OTR/L,CLT 04/17/2019, 8:57 AM  [facility] [address], [place], [address] Phone: [phone]   Fax:  [phone]  Name: FERNADO BRIGANTE MRN: [id number] Date of Birth: [birth year]

## 2019-04-17 NOTE — Patient Instructions (Signed)
Same splint on =most all the time  Pain free AROM for thumb and wrist

## 2019-04-22 ENCOUNTER — Other Ambulatory Visit: Payer: Self-pay

## 2019-04-22 ENCOUNTER — Ambulatory Visit: Payer: BC Managed Care – PPO | Admitting: Occupational Therapy

## 2019-04-22 DIAGNOSIS — M25631 Stiffness of right wrist, not elsewhere classified: Secondary | ICD-10-CM

## 2019-04-22 DIAGNOSIS — M25531 Pain in right wrist: Secondary | ICD-10-CM

## 2019-04-22 DIAGNOSIS — M25641 Stiffness of right hand, not elsewhere classified: Secondary | ICD-10-CM

## 2019-04-22 DIAGNOSIS — M6281 Muscle weakness (generalized): Secondary | ICD-10-CM

## 2019-04-22 NOTE — Patient Instructions (Signed)
Same - if sitting at home - doing nothing can take splint off

## 2019-04-22 NOTE — Therapy (Signed)
[facility] [address], [place], [address] Phone: [phone]   Fax:  [phone]  Occupational Therapy Treatment  Patient Details  Name: Spencer Heath MRN: [id number] Date of Birth: [birth year] Referring Provider (OT): Candelaria Stagers   Encounter Date: 04/22/2019  OT End of Session - 04/22/19 0746    Visit Number  5    Number of Visits  11    Date for OT Re-Evaluation  05/06/19    OT Start Time  0733    OT Stop Time  0822    OT Time Calculation (min)  49 min    Activity Tolerance  Patient tolerated treatment well    Behavior During Therapy  [facility] for tasks assessed/performed       Past Medical History:  Diagnosis Date  . Arthritis   . Crohn's disease ([place])   . GERD (gastroesophageal reflux disease)   . Hypertension     Past Surgical History:  Procedure Laterality Date  . ABDOMINAL SURGERY    . APPENDECTOMY    . testical removed    . TESTICLE SURGERY      There were no vitals filed for this visit.  Subjective Assessment - 04/22/19 0748    Subjective   It is getting better but gets really stiff in the splint - but I do my exercises - my motion okay - but I do take it off little at home    Pertinent History  Pt had thumb and wrist pain since going to urgent care on 9/21 - took some medication and splint - then to ER for same pain 10/2, refer to Dr Candelaria Stagers for shot 02/18/2019 -  but cont to have same pain , splint he feels make pain worse - refer to OT /hand therapy    Patient Stated Goals  Want to pain better so I can do my work , do things around the house - and I do like to play games    Currently in Pain?  Yes    Pain Score  3    7 tenderness over distal radius      AROM for wrist and thumb WFL no  pain  Except RD ,UD end range and RA of thumb end range  Oppositioin to base of 5th pain free   Finkelstein 2-3/10  But tenderness over distal radius head still 7/10       skin check done  prior to ionto - no issues  - and pt to keep patch on for hour afterwards               OT Treatments/Exercises (OP) - 04/22/19 0001      Iontophoresis   Type of Iontophoresis  Dexamethasone    Location  1st dorsal compartment R    Dose  2.0 current ,med patch     Time  19      RUE Contrast Bath   Time  9 minutes    Comments  SOC prior to ROM         Soft tissue mobs after contrast - graston tool nr 2 sweeping over dorsal and volar forearm  And MC and webspace soft tissue by OT with Thumb RA  Pain free AAROM for wrist flexion , ext, RD, UD  Thumb PA And RA , opposition tobaseof 5th              OT Education - 04/22/19 0746    Education Details  progress, HEP and splint wearing    Person(s) Educated  Patient    Methods  Explanation;Demonstration;Tactile cues;Verbal cues;Handout    Comprehension  Verbalized understanding;Returned demonstration       OT Short Term Goals - 04/01/19 1139      OT SHORT TERM GOAL #1   Title  Pt to be independent in HEP for wearing of splint , pain free AROM , and decrease pain    Baseline  pain 7/10 finkelstein , 10/10 with tenderness over distal radius head    Time  3    Period  Weeks    Status  New    Target Date  04/22/19        OT Long Term Goals - 04/01/19 1141      OT LONG TERM GOAL #1   Title  Pain and tenderness over  R 1st dorsal compartment decrease less than 3/10 to wean out of splint    Baseline  tenderness distal radius head 10/10 , Finkelstein 7/10    Time  4    Period  Weeks    Status  New    Target Date  04/29/19      OT LONG TERM GOAL #2   Title  Pt to AROM for R thumb and wrist WNL witn symptoms less than 1-2/10    Baseline  pain with use end of day 7/10 - finkelstein 7/10    Time  5    Period  Weeks    Status  New    Target Date  05/06/19      OT LONG TERM GOAL #3   Title  Function score on PRWHE improve with more than 20 points    Baseline  Function score on PRWHE at eval 33/50     Time  5    Period  Weeks    Status  New    Target Date  05/06/19      OT LONG TERM GOAL #4   Title  R grip and prehension improve to more than 50% compare to L without increase syptoms    Baseline  NT    Time  5    Period  Weeks    Status  New    Target Date  05/06/19            Plan - 04/22/19 0747    Clinical Impression Statement  Pt making progress in pain with Wynn Maudlin 3/10 - and AROM only painfull with RD, UD 3/10 , thumb RA- but still 7/10 tenderness over distal radius - pt report stiff and pain over dorsal wrist at times because of immobilization - but pt to do HEP for AROM pain free - 2-3 x day    OT Occupational Profile and History  Problem Focused Assessment - Including review of records relating to presenting problem    Occupational performance deficits (Please refer to evaluation for details):  ADL's;IADL's;Rest and Sleep;Work;Play;Leisure    Body Structure / Function / Physical Skills  ADL;Flexibility;ROM;UE functional use;IADL;Pain;Edema    Rehab Potential  Fair    Clinical Decision Making  Limited treatment options, no task modification necessary    Comorbidities Affecting Occupational Performance:  None    Modification or Assistance to Complete Evaluation   No modification of tasks or assist necessary to complete eval    OT Frequency  2x / week    OT Duration  4 weeks    OT Treatment/Interventions  Self-care/ADL training;Therapeutic exercise;Iontophoresis;Contrast Bath;Ultrasound;DME and/or AE instruction;Manual Therapy;Fluidtherapy;Splinting;Patient/family education  Plan  6th  ionto session next week - assess progress with splint wearing and HEP    OT Home Exercise Plan  see pt instruction    Consulted and Agree with Plan of Care  Patient       Patient will benefit from skilled therapeutic intervention in order to improve the following deficits and impairments:   Body Structure / Function / Physical Skills: ADL, Flexibility, ROM, UE functional use, IADL,  Pain, Edema       Visit Diagnosis: Stiffness of right wrist, not elsewhere classified  Stiffness of right hand, not elsewhere classified  Muscle weakness (generalized)  Pain in right wrist    Problem List Patient Active Problem List   Diagnosis Date Noted  . Small bowel obstruction ([place]) 01/24/2015  . Crohn's disease ([place]) 01/24/2015    Rosalyn Gess OTR/L,CLT 04/22/2019, 8:11 AM  [facility] [address] [place], [address] Phone: [phone]   Fax:  [phone]  Name: COLLYN RIBAS MRN: [id number] Date of Birth: 1965-08-25

## 2019-04-24 ENCOUNTER — Other Ambulatory Visit: Payer: Self-pay

## 2019-04-24 ENCOUNTER — Ambulatory Visit: Payer: BC Managed Care – PPO | Admitting: Occupational Therapy

## 2019-04-24 DIAGNOSIS — M25631 Stiffness of right wrist, not elsewhere classified: Secondary | ICD-10-CM | POA: Diagnosis not present

## 2019-04-24 DIAGNOSIS — M25531 Pain in right wrist: Secondary | ICD-10-CM

## 2019-04-24 DIAGNOSIS — M25641 Stiffness of right hand, not elsewhere classified: Secondary | ICD-10-CM

## 2019-04-24 DIAGNOSIS — M6281 Muscle weakness (generalized): Secondary | ICD-10-CM

## 2019-04-24 NOTE — Therapy (Signed)
[facility] [address] [place], [address] Phone: [phone]   Fax:  [phone]  Occupational Therapy Treatment  Patient Details  Name: Spencer Heath MRN: [id number] Date of Birth: [birth year] Referring Provider (OT): Candelaria Stagers   Encounter Date: 04/24/2019  OT End of Session - 04/24/19 0827    Visit Number  6    Number of Visits  11    Date for OT Re-Evaluation  05/06/19    OT Start Time  0745    OT Stop Time  0833    OT Time Calculation (min)  48 min    Activity Tolerance  Patient tolerated treatment well    Behavior During Therapy  [facility] for tasks assessed/performed       Past Medical History:  Diagnosis Date  . Arthritis   . Crohn's disease ([place])   . GERD (gastroesophageal reflux disease)   . Hypertension     Past Surgical History:  Procedure Laterality Date  . ABDOMINAL SURGERY    . APPENDECTOMY    . testical removed    . TESTICLE SURGERY      There were no vitals filed for this visit.  Subjective Assessment - 04/24/19 0823    Subjective   I had terrible night - my top of wrist hurt and that burning pain from thumb up into wrist and forearm - about 6-7/10    Pertinent History  Pt had thumb and wrist pain since going to urgent care on 9/21 - took some medication and splint - then to ER for same pain 10/2, refer to Dr Candelaria Stagers for shot 02/18/2019 -  but cont to have same pain , splint he feels make pain worse - refer to OT /hand therapy    Patient Stated Goals  Want to pain better so I can do my work , do things around the house - and I do like to play games    Currently in Pain?  Yes    Pain Score  3    tenderness over distal radius 8/10   Pain Location  Wrist    Pain Orientation  Right    Pain Descriptors / Indicators  Aching;Tender;Burning    Pain Type  Acute pain    Pain Onset  More than a month ago         AROM for wrist and thumb WFL no  pain  Except RD and  RA of thumb  end range - burning pain  Oppositioin to base of 5th pain free   Finkelstein 2-3/10  But tenderness over distal radius head still 7-8/10       Pt report had bad night - and reports pain on dorsal of wrist at times more than burning pain over 1st dorsal compartment. Change pt's splint to wear neoprene wrist/thumb wrap at home and sleeping -and slide hard thumb spica piece on at work         Skin check done prior to ionto - same done med patch at 2.0 current distal radius -19 min  Pt to keep on for hour afterwards    OT Treatments/Exercises (OP) - 04/24/19 0001      Cryotherapy   Number Minutes Cryotherapy  5 Minutes    Cryotherapy Location  Hand;Wrist    Type of Cryotherapy  Ice pack   prior to [place]   Number Minutes Fluidotherapy  [place] Location  Hand;Wrist    Comments  AROM to assess pain with idecrease stiffness         Pt had less pain after fluido in thumb RA - but RD of wrist same  And tenderness   Soft tissue mobs after contrast - graston tool nr 2 sweeping over dorsal and volar forearm  And MC and webspace soft tissue by OT with Thumb RA  Pain free AAROM for wrist flexion , ext, RD, UD  Thumb PA And RA , opposition tobaseof 5th          OT Education - 04/24/19 0827    Education Details  progress, HEP and splint wearing    Person(s) Educated  Patient    Methods  Explanation;Demonstration;Tactile cues;Verbal cues;Handout    Comprehension  Verbalized understanding;Returned demonstration       OT Short Term Goals - 04/01/19 1139      OT SHORT TERM GOAL #1   Title  Pt to be independent in HEP for wearing of splint , pain free AROM , and decrease pain    Baseline  pain 7/10 finkelstein , 10/10 with tenderness over distal radius head    Time  3    Period  Weeks    Status  New    Target Date  04/22/19        OT Long Term Goals - 04/01/19 1141      OT LONG TERM GOAL #1   Title  Pain  and tenderness over  R 1st dorsal compartment decrease less than 3/10 to wean out of splint    Baseline  tenderness distal radius head 10/10 , Finkelstein 7/10    Time  4    Period  Weeks    Status  New    Target Date  04/29/19      OT LONG TERM GOAL #2   Title  Pt to AROM for R thumb and wrist WNL witn symptoms less than 1-2/10    Baseline  pain with use end of day 7/10 - finkelstein 7/10    Time  5    Period  Weeks    Status  New    Target Date  05/06/19      OT LONG TERM GOAL #3   Title  Function score on PRWHE improve with more than 20 points    Baseline  Function score on PRWHE at eval 33/50    Time  5    Period  Weeks    Status  New    Target Date  05/06/19      OT LONG TERM GOAL #4   Title  R grip and prehension improve to more than 50% compare to L without increase syptoms    Baseline  NT    Time  5    Period  Weeks    Status  New    Target Date  05/06/19            Plan - 04/24/19 0827    Clinical Impression Statement  Pt made progress earlier this week - but coming in this date with report of increase pain last night- some of pt pain can be stiffness from wearing splint - but pt still 8/10 tender over distal radius , and pain with RD of wrist and thumb end range RA - pt to call so long Dr Roland Rack to make appt incase pain is not better but next week with 2 more session of ionto -1st 2 ionto pt had to  far apart with his work schedule    OT Occupational Profile and History  Problem Focused Assessment - Including review of records relating to presenting problem    Occupational performance deficits (Please refer to evaluation for details):  ADL's;IADL's;Rest and Sleep;Work;Play;Leisure    Body Structure / Function / Physical Skills  ADL;Flexibility;ROM;UE functional use;IADL;Pain;Edema    Rehab Potential  Fair    Clinical Decision Making  Limited treatment options, no task modification necessary    Comorbidities Affecting Occupational Performance:  None     Modification or Assistance to Complete Evaluation   No modification of tasks or assist necessary to complete eval    OT Frequency  2x / week    OT Duration  --   1 week   OT Treatment/Interventions  Self-care/ADL training;Therapeutic exercise;Iontophoresis;Contrast Bath;Ultrasound;DME and/or AE instruction;Manual Therapy;Fluidtherapy;Splinting;Patient/family education    Plan  7th  ionto session next week - assess progress with splint wearing and HEP    OT Home Exercise Plan  see pt instruction    Consulted and Agree with Plan of Care  Patient       Patient will benefit from skilled therapeutic intervention in order to improve the following deficits and impairments:   Body Structure / Function / Physical Skills: ADL, Flexibility, ROM, UE functional use, IADL, Pain, Edema       Visit Diagnosis: Stiffness of right wrist, not elsewhere classified  Stiffness of right hand, not elsewhere classified  Muscle weakness (generalized)  Pain in right wrist    Problem List Patient Active Problem List   Diagnosis Date Noted  . Small bowel obstruction ([place]) 01/24/2015  . Crohn's disease ([place]) 01/24/2015    Rosalyn Gess OTR/L,CLT 04/24/2019, 8:32 AM  [facility] [address], [place], [address] Phone: [phone]   Fax:  [phone]  Name: Spencer Heath MRN: [id number] Date of Birth: 09-Nov-1965

## 2019-04-24 NOTE — Patient Instructions (Signed)
Change pt's splint to wearing night time and at home neoprene thumb /wrist wrap -and slide hard piece thumb spica on for work or strenuous activities  Cont heat and ice -and Pain free AROM for digits, thumb and wrist

## 2019-04-28 ENCOUNTER — Ambulatory Visit: Payer: BC Managed Care – PPO | Admitting: Occupational Therapy

## 2019-04-28 ENCOUNTER — Other Ambulatory Visit: Payer: Self-pay

## 2019-04-28 DIAGNOSIS — M25631 Stiffness of right wrist, not elsewhere classified: Secondary | ICD-10-CM | POA: Diagnosis not present

## 2019-04-28 DIAGNOSIS — M25641 Stiffness of right hand, not elsewhere classified: Secondary | ICD-10-CM

## 2019-04-28 DIAGNOSIS — M6281 Muscle weakness (generalized): Secondary | ICD-10-CM

## 2019-04-28 DIAGNOSIS — M25531 Pain in right wrist: Secondary | ICD-10-CM

## 2019-04-28 NOTE — Therapy (Signed)
[facility] [address], [place], [address] Phone: [phone]   Fax:  [phone]  Occupational Therapy Treatment  Patient Details  Name: Spencer Heath MRN: [id number] Date of Birth: 1965/06/20 Referring Provider (OT): Candelaria Stagers   Encounter Date: 04/28/2019  OT End of Session - 04/28/19 0927    Visit Number  7    Number of Visits  11    Date for OT Re-Evaluation  05/06/19    OT Start Time  0733    OT Stop Time  0825    OT Time Calculation (min)  52 min    Activity Tolerance  Patient tolerated treatment well    Behavior During Therapy  [facility] for tasks assessed/performed       Past Medical History:  Diagnosis Date  . Arthritis   . Crohn's disease ([place])   . GERD (gastroesophageal reflux disease)   . Hypertension     Past Surgical History:  Procedure Laterality Date  . ABDOMINAL SURGERY    . APPENDECTOMY    . testical removed    . TESTICLE SURGERY      There were no vitals filed for this visit.  Subjective Assessment - 04/28/19 0925    Subjective   It felt better in the night and morning the soft splint -did add the hard one at work - but Saturday it hurt again -and certain movements    Pertinent History  Pt had thumb and wrist pain since going to urgent care on 9/21 - took some medication and splint - then to ER for same pain 10/2, refer to Dr Candelaria Stagers for shot 02/18/2019 -  but cont to have same pain , splint he feels make pain worse - refer to OT /hand therapy    Patient Stated Goals  Want to pain better so I can do my work , do things around the house - and I do like to play games    Currently in Pain?  Yes    Pain Score  3    finkelstein- but tenderness over distal radius stil 8/10   Pain Location  Wrist    Pain Orientation  Right    Pain Descriptors / Indicators  Tender    Pain Type  Acute pain         [facility] OT Assessment - 04/28/19 0001      Strength   Right Hand Grip (lbs)   60    Right Hand Lateral Pinch  18 lbs    Right Hand 3 Point Pinch  12 lbs    Left Hand Grip (lbs)  75    Left Hand Lateral Pinch  18 lbs    Left Hand 3 Point Pinch  13 lbs        AROM for wrist and thumb WFL with nopain except RD and  RA of thumb end range - burning pain  Oppositioin to base of 5th pain free  Finkelstein 2-3/10  But tenderness over distal radius head still 7-8/10  Discuss with pt to make appt with surgeon - to see - pain cont same with tendereness, and thumb RA and RD of wrist  And not tolerating hard splints- prefab that Dr Candelaria Stagers provided and custom that I made     Change pt's splint  Last time because of night time and morning pain to wear neoprene wrist/thumb wrap at home and sleeping -and slide hard thumb spica piece on at work   Skin  check done prior to ionto - same done med patch at 2.0 current distal radius -19 min  Pt to keep on for hour afterwards         OT Treatments/Exercises (OP) - 04/28/19 0001      Iontophoresis   Type of Iontophoresis  Dexamethasone    Location  1st dorsal compartment R    Dose  2.0 current ,med patch     Time  19      RUE Contrast Bath   Time  9 minutes    Comments  prior to soft tissue and AROM        Soft tissue mobs after contrast - graston tool nr 2 sweeping over dorsal and volar forearm  And MC and webspace soft tissue by OT with Thumb RA  Pain free AAROM for wrist flexion , ext, RD, UD  Thumb PA And RA , opposition tobaseof 5th      OT Education - 04/28/19 0927    Education Details  splint wearing and cont issues    Person(s) Educated  Patient    Methods  Explanation;Demonstration;Tactile cues;Verbal cues;Handout    Comprehension  Verbalized understanding;Returned demonstration       OT Short Term Goals - 04/28/19 0931      OT SHORT TERM GOAL #1   Title  Pt to be independent in HEP for wearing of splint , pain free AROM , and decrease pain    Baseline  pain 7/10 finkelstein ,  10/10 with tenderness over distal radius head at eval - no finkelstein 3/10 and tenderness 8/10    Status  Partially Met        OT Long Term Goals - 04/28/19 0932      OT LONG TERM GOAL #1   Title  Pain and tenderness over  R 1st dorsal compartment decrease less than 3/10 to wean out of splint    Baseline  tenderness distal radius head 10/10 , Finkelstein 7/10 at eval - FInkelstein 3/10 but tenderness cont to be 8/10    Status  Partially Met      OT LONG TERM GOAL #2   Title  Pt to AROM for R thumb and wrist WNL witn symptoms less than 1-2/10    Baseline  AROM WFL for thumb and wrist - but pain with AROM 3-7/10 Wynn [place] 3/10    Status  Partially Met      OT LONG TERM GOAL #3   Title  Function score on PRWHE improve with more than 20 points    Baseline  Function score on PRWHE at eval 33/50 - NT - pt to see MD Friday    Status  Partially Met      OT LONG TERM GOAL #4   Title  R grip and prehension improve to more than 50% compare to L without increase syptoms    Baseline  Prehension 18 lat grip L and R , 3 point 12 lbs and L 13 lbs  no pain    Status  Achieved            Plan - 04/28/19 0928    Clinical Impression Statement  Pt showed increase pain with AROM and Finkelstein - but tenderness same 8/10 over 1st dorsal compartment - and pain with thumb RA and wrist RD - recommend for pt to make appt with surgeon - He report he seen Dr Roland Rack in the past for shoulder - pt did call while he was here - and  has appt for Friday - pt to cont in the meantime with splint wearing    OT Occupational Profile and History  Problem Focused Assessment - Including review of records relating to presenting problem    Occupational performance deficits (Please refer to evaluation for details):  ADL's;IADL's;Rest and Sleep;Work;Play;Leisure    Body Structure / Function / Physical Skills  ADL;Flexibility;ROM;UE functional use;IADL;Pain;Edema    Rehab Potential  Fair    Clinical Decision Making   Limited treatment options, no task modification necessary    Comorbidities Affecting Occupational Performance:  None    Modification or Assistance to Complete Evaluation   No modification of tasks or assist necessary to complete eval    OT Frequency  2x / week    OT Duration  2 weeks    OT Treatment/Interventions  Self-care/ADL training;Therapeutic exercise;Iontophoresis;Contrast Bath;Ultrasound;DME and/or AE instruction;Manual Therapy;Fluidtherapy;Splinting;Patient/family education    Plan  Pt to see MD Noralee Chars Friday    OT Home Exercise Plan  see pt instruction    Consulted and Agree with Plan of Care  Patient       Patient will benefit from skilled therapeutic intervention in order to improve the following deficits and impairments:   Body Structure / Function / Physical Skills: ADL, Flexibility, ROM, UE functional use, IADL, Pain, Edema       Visit Diagnosis: Stiffness of right wrist, not elsewhere classified  Stiffness of right hand, not elsewhere classified  Muscle weakness (generalized)  Pain in right wrist    Problem List Patient Active Problem List   Diagnosis Date Noted  . Small bowel obstruction ([place]) 01/24/2015  . Crohn's disease ([place]) 01/24/2015    Rosalyn Gess OTR/L,CLT 04/28/2019, 9:36 AM  [place] [facility] [address], [place], [address] Phone: [phone]   Fax:  [phone]  Name: Spencer Heath MRN: [id number] Date of Birth: [birth year]

## 2019-04-28 NOTE — Patient Instructions (Signed)
Pt to cont with splint -and to see MD

## 2019-05-01 ENCOUNTER — Encounter: Payer: BC Managed Care – PPO | Admitting: Occupational Therapy

## 2019-05-06 ENCOUNTER — Other Ambulatory Visit: Payer: Self-pay | Admitting: Surgery

## 2019-05-12 ENCOUNTER — Other Ambulatory Visit: Payer: Self-pay

## 2019-05-12 ENCOUNTER — Encounter
Admission: RE | Admit: 2019-05-12 | Discharge: 2019-05-12 | Disposition: A | Discharge status: Home / Self Care | Payer: BC Managed Care – PPO | Source: Ambulatory Visit | Attending: Surgery | Admitting: Surgery

## 2019-05-12 ENCOUNTER — Encounter: Payer: Self-pay | Admitting: Surgery

## 2019-05-12 ENCOUNTER — Other Ambulatory Visit
Admission: RE | Admit: 2019-05-12 | Discharge: 2019-05-12 | Disposition: A | Discharge status: Home / Self Care | Payer: BC Managed Care – PPO | Source: Ambulatory Visit | Attending: Surgery | Admitting: Surgery

## 2019-05-12 DIAGNOSIS — Z01812 Encounter for preprocedural laboratory examination: Secondary | ICD-10-CM | POA: Diagnosis not present

## 2019-05-12 DIAGNOSIS — Z20828 Contact with and (suspected) exposure to other viral communicable diseases: Secondary | ICD-10-CM | POA: Diagnosis not present

## 2019-05-12 NOTE — Patient Instructions (Signed)
Your procedure is scheduled on: 05/14/2019 Wed Report to Same Day Surgery 2nd floor medical mall [facility] Entrance-take elevator on left to 2nd floor.  Check in with surgery information desk.) To find out your arrival time please call [phone] between 1PM - 3PM on 05/13/2019 Tues  Remember: Instructions that are not followed completely may result in serious medical risk, up to and including death, or upon the discretion of your surgeon and anesthesiologist your surgery may need to be rescheduled.    _x___ 1. Do not eat food after midnight the night before your procedure. You may drink clear liquids up to 2 hours before you are scheduled to arrive at the hospital for your procedure.  Do not drink clear liquids within 2 hours of your scheduled arrival to the hospital.  Clear liquids include  --Water or Apple juice without pulp  --Clear carbohydrate beverage such as ClearFast or Gatorade  --Black Coffee or Clear Tea (No milk, no creamers, do not add anything to                  the coffee or Tea Type 1 and type 2 diabetics should only drink water.   ____Ensure clear carbohydrate drink on the way to the hospital for bariatric patients  ____Ensure clear carbohydrate drink 3 hours before surgery.   No gum chewing or hard candies.     __x__ 2. No Alcohol for 24 hours before or after surgery.   __x__3. No Smoking or e-cigarettes for 24 prior to surgery.  Do not use any chewable tobacco products for at least 6 hour prior to surgery   ____  4. Bring all medications with you on the day of surgery if instructed.    __x__ 5. Notify your doctor if there is any change in your medical condition     (cold, fever, infections).    x___6. On the morning of surgery brush your teeth with toothpaste and water.  You may rinse your mouth with mouth wash if you wish.  Do not swallow any toothpaste or mouthwash.   Do not wear jewelry, make-up, hairpins, clips or nail polish.  Do not wear lotions,  powders, or perfumes. You may wear deodorant.  Do not shave 48 hours prior to surgery. Men may shave face and neck.  Do not bring valuables to the hospital.    [facility] is not responsible for any belongings or valuables.               Contacts, dentures or bridgework may not be worn into surgery.  Leave your suitcase in the car. After surgery it may be brought to your room.  For patients admitted to the hospital, discharge time is determined by your                       treatment team.  _  Patients discharged the day of surgery will not be allowed to drive home.  You will need someone to drive you home and stay with you the night of your procedure.    Please read over the following fact sheets that you were given:   [facility] Preparing for Surgery and or MRSA Information   _x___ Take anti-hypertensive listed below, cardiac, seizure, asthma,     anti-reflux and psychiatric medicines. These include:  1. amLODipine (NORVASC) 5 MG tablet  2.pantoprazole (PROTONIX) 40 MG tablet  3.  4.  5.  6.  ____Fleets enema or Magnesium Citrate as  directed.   _x___ Use CHG Soap or sage wipes as directed on instruction sheet   ____ Use inhalers on the day of surgery and bring to hospital day of surgery  ____ Stop Metformin and Janumet 2 days prior to surgery.    ____ Take 1/2 of usual insulin dose the night before surgery and none on the morning     surgery.   _x___ Follow recommendations from Cardiologist, Pulmonologist or PCP regarding          stopping Aspirin, Coumadin, Plavix ,Eliquis, Effient, or Pradaxa, and Pletal.  X____Stop Anti-inflammatories such as Advil, Aleve, Ibuprofen, Motrin, Naproxen, Naprosyn, Goodies powders or aspirin products. OK to take Tylenol and                          Celebrex.   _x___ Stop supplements until after surgery.  But may continue Vitamin D, Vitamin B,       and multivitamin.   ____ Bring C-Pap to the hospital.

## 2019-05-13 LAB — SARS CORONAVIRUS 2 (TAT 6-24 HRS): SARS Coronavirus 2: NEGATIVE

## 2019-05-14 ENCOUNTER — Encounter
Admission: RE | Disposition: A | Discharge status: Home / Self Care | Payer: Self-pay | Source: Home / Self Care | Attending: Surgery

## 2019-05-14 ENCOUNTER — Encounter: Payer: Self-pay | Admitting: Surgery

## 2019-05-14 ENCOUNTER — Other Ambulatory Visit: Payer: Self-pay

## 2019-05-14 ENCOUNTER — Ambulatory Visit: Payer: BC Managed Care – PPO | Admitting: Family

## 2019-05-14 ENCOUNTER — Ambulatory Visit
Admission: RE | Admit: 2019-05-14 | Discharge: 2019-05-14 | Disposition: A | Discharge status: Home / Self Care | Payer: BC Managed Care – PPO | Attending: Surgery | Admitting: Surgery

## 2019-05-14 DIAGNOSIS — K5090 Crohn's disease, unspecified, without complications: Secondary | ICD-10-CM | POA: Diagnosis not present

## 2019-05-14 DIAGNOSIS — I10 Essential (primary) hypertension: Secondary | ICD-10-CM | POA: Insufficient documentation

## 2019-05-14 DIAGNOSIS — K219 Gastro-esophageal reflux disease without esophagitis: Secondary | ICD-10-CM | POA: Diagnosis not present

## 2019-05-14 DIAGNOSIS — G4730 Sleep apnea, unspecified: Secondary | ICD-10-CM | POA: Insufficient documentation

## 2019-05-14 DIAGNOSIS — Z79899 Other long term (current) drug therapy: Secondary | ICD-10-CM | POA: Diagnosis not present

## 2019-05-14 DIAGNOSIS — M654 Radial styloid tenosynovitis [de Quervain]: Secondary | ICD-10-CM | POA: Insufficient documentation

## 2019-05-14 HISTORY — PX: DORSAL COMPARTMENT RELEASE: SHX5039

## 2019-05-14 SURGERY — RELEASE, FIRST DORSAL COMPARTMENT, HAND
Anesthesia: General | Laterality: Right

## 2019-05-14 MED ORDER — ONDANSETRON HCL 4 MG/2ML IJ SOLN
INTRAMUSCULAR | Status: DC | PRN
  Administered 2019-05-14: 4 mg via INTRAVENOUS

## 2019-05-14 MED ORDER — FENTANYL CITRATE (PF) 100 MCG/2ML IJ SOLN
INTRAMUSCULAR | Status: AC
  Filled 2019-05-14: qty 2

## 2019-05-14 MED ORDER — METOCLOPRAMIDE HCL 5 MG/ML IJ SOLN: 5.0000 mg | Freq: Three times a day (TID) | INTRAMUSCULAR | Status: DC | PRN

## 2019-05-14 MED ORDER — ROCURONIUM BROMIDE 50 MG/5ML IV SOLN
INTRAVENOUS | Status: AC
  Filled 2019-05-14: qty 1

## 2019-05-14 MED ORDER — HYDROCODONE-ACETAMINOPHEN 5-325 MG PO TABS: 1.0000 | ORAL_TABLET | Freq: Four times a day (QID) | ORAL | 0 refills | Status: DC | PRN

## 2019-05-14 MED ORDER — SUGAMMADEX SODIUM 500 MG/5ML IV SOLN
INTRAVENOUS | Status: DC | PRN
  Administered 2019-05-14: 200 mg via INTRAVENOUS

## 2019-05-14 MED ORDER — CEFAZOLIN SODIUM-DEXTROSE 2-4 GM/100ML-% IV SOLN
INTRAVENOUS | Status: AC
  Filled 2019-05-14: qty 100

## 2019-05-14 MED ORDER — CHLORHEXIDINE GLUCONATE 4 % EX LIQD: 60.0000 mL | Freq: Once | CUTANEOUS | Status: DC

## 2019-05-14 MED ORDER — METOCLOPRAMIDE HCL 10 MG PO TABS: 5.0000 mg | ORAL_TABLET | Freq: Three times a day (TID) | ORAL | Status: DC | PRN

## 2019-05-14 MED ORDER — PROPOFOL 10 MG/ML IV BOLUS
INTRAVENOUS | Status: AC
  Filled 2019-05-14: qty 40

## 2019-05-14 MED ORDER — FENTANYL CITRATE (PF) 100 MCG/2ML IJ SOLN: 25.0000 ug | INTRAMUSCULAR | Status: DC | PRN

## 2019-05-14 MED ORDER — BUPIVACAINE HCL (PF) 0.5 % IJ SOLN
INTRAMUSCULAR | Status: AC
  Filled 2019-05-14: qty 30

## 2019-05-14 MED ORDER — BUPIVACAINE HCL (PF) 0.5 % IJ SOLN
INTRAMUSCULAR | Status: DC | PRN
  Administered 2019-05-14: 5 mL

## 2019-05-14 MED ORDER — PROMETHAZINE HCL 25 MG/ML IJ SOLN: 6.2500 mg | INTRAMUSCULAR | Status: DC | PRN

## 2019-05-14 MED ORDER — POTASSIUM CHLORIDE IN NACL 20-0.9 MEQ/L-% IV SOLN: INTRAVENOUS | Status: DC

## 2019-05-14 MED ORDER — ONDANSETRON HCL 4 MG/2ML IJ SOLN
INTRAMUSCULAR | Status: AC
  Filled 2019-05-14: qty 2

## 2019-05-14 MED ORDER — FENTANYL CITRATE (PF) 100 MCG/2ML IJ SOLN
INTRAMUSCULAR | Status: DC | PRN
  Administered 2019-05-14 (×2): 50 ug via INTRAVENOUS

## 2019-05-14 MED ORDER — CEFAZOLIN SODIUM-DEXTROSE 2-4 GM/100ML-% IV SOLN
2.0000 g | INTRAVENOUS | Status: AC
  Administered 2019-05-14: 2 g via INTRAVENOUS

## 2019-05-14 MED ORDER — ONDANSETRON HCL 4 MG PO TABS: 4.0000 mg | ORAL_TABLET | Freq: Four times a day (QID) | ORAL | Status: DC | PRN

## 2019-05-14 MED ORDER — ONDANSETRON HCL 4 MG/2ML IJ SOLN: 4.0000 mg | Freq: Four times a day (QID) | INTRAMUSCULAR | Status: DC | PRN

## 2019-05-14 MED ORDER — MIDAZOLAM HCL 2 MG/2ML IJ SOLN
INTRAMUSCULAR | Status: AC
  Filled 2019-05-14: qty 2

## 2019-05-14 MED ORDER — PROPOFOL 10 MG/ML IV BOLUS
INTRAVENOUS | Status: DC | PRN
  Administered 2019-05-14: 210 mg via INTRAVENOUS
  Administered 2019-05-14: 50 mg via INTRAVENOUS
  Administered 2019-05-14: 40 mg via INTRAVENOUS

## 2019-05-14 MED ORDER — HYDROCODONE-ACETAMINOPHEN 5-325 MG PO TABS: 1.0000 | ORAL_TABLET | ORAL | Status: DC | PRN

## 2019-05-14 MED ORDER — KETOROLAC TROMETHAMINE 30 MG/ML IJ SOLN
INTRAMUSCULAR | Status: AC
  Filled 2019-05-14: qty 1

## 2019-05-14 MED ORDER — DEXAMETHASONE SODIUM PHOSPHATE 10 MG/ML IJ SOLN
INTRAMUSCULAR | Status: AC
  Filled 2019-05-14: qty 1

## 2019-05-14 MED ORDER — KETOROLAC TROMETHAMINE 30 MG/ML IJ SOLN
INTRAMUSCULAR | Status: DC | PRN
  Administered 2019-05-14: 30 mg via INTRAVENOUS

## 2019-05-14 MED ORDER — LACTATED RINGERS IV SOLN: INTRAVENOUS | Status: DC

## 2019-05-14 MED ORDER — DEXAMETHASONE SODIUM PHOSPHATE 10 MG/ML IJ SOLN
INTRAMUSCULAR | Status: DC | PRN
  Administered 2019-05-14: 10 mg via INTRAVENOUS

## 2019-05-14 MED ORDER — HYDROCODONE-ACETAMINOPHEN 5-325 MG PO TABS
ORAL_TABLET | ORAL | Status: AC
  Administered 2019-05-14: 1 via ORAL
  Filled 2019-05-14: qty 1

## 2019-05-14 SURGICAL SUPPLY — 57 items
BIT DRILL JUGRKNT W/NDL BIT2.9 (DRILL) ×1 IMPLANT
BLADE SURG SZ10 CARB STEEL (BLADE) ×3 IMPLANT
BNDG COHESIVE 4X5 TAN STRL (GAUZE/BANDAGES/DRESSINGS) ×3 IMPLANT
BNDG ELASTIC 2X5.8 VLCR STR LF (GAUZE/BANDAGES/DRESSINGS) ×2 IMPLANT
BNDG ELASTIC 4X5.8 VLCR STR LF (GAUZE/BANDAGES/DRESSINGS) ×3 IMPLANT
BNDG ESMARK 4X12 TAN STRL LF (GAUZE/BANDAGES/DRESSINGS) ×3 IMPLANT
CANISTER SUCT 1200ML W/VALVE (MISCELLANEOUS) ×3 IMPLANT
CHLORAPREP W/TINT 26 (MISCELLANEOUS) ×4 IMPLANT
CLOSURE WOUND 1/4X4 (GAUZE/BANDAGES/DRESSINGS) ×1
CORD BIP STRL DISP 12FT (MISCELLANEOUS) ×3 IMPLANT
COVER WAND RF STERILE (DRAPES) ×3 IMPLANT
CUFF TOURN SGL QUICK 18X4 (TOURNIQUET CUFF) IMPLANT
CUFF TOURN SGL QUICK 24 (TOURNIQUET CUFF) ×2
CUFF TOURN SGL QUICK 30 (TOURNIQUET CUFF)
CUFF TRNQT CYL 24X4X16.5-23 (TOURNIQUET CUFF) IMPLANT
CUFF TRNQT CYL 30X4X21-28X (TOURNIQUET CUFF) IMPLANT
DRAPE SURG 17X11 SM STRL (DRAPES) ×3 IMPLANT
DRILL JUGGERKNOT W/NDL BIT 2.9 (DRILL)
ELECT REM PT RETURN 9FT ADLT (ELECTROSURGICAL) ×3
ELECTRODE REM PT RTRN 9FT ADLT (ELECTROSURGICAL) ×1 IMPLANT
FORCEPS JEWEL BIP 4-3/4 STR (INSTRUMENTS) ×3 IMPLANT
GAUZE SPONGE 4X4 12PLY STRL (GAUZE/BANDAGES/DRESSINGS) ×3 IMPLANT
GAUZE XEROFORM 1X8 LF (GAUZE/BANDAGES/DRESSINGS) ×3 IMPLANT
GLOVE BIO SURGEON STRL SZ8 (GLOVE) ×6 IMPLANT
GLOVE INDICATOR 8.0 STRL GRN (GLOVE) ×3 IMPLANT
GOWN STRL REUS W/ TWL LRG LVL3 (GOWN DISPOSABLE) ×1 IMPLANT
GOWN STRL REUS W/ TWL XL LVL3 (GOWN DISPOSABLE) ×1 IMPLANT
GOWN STRL REUS W/TWL LRG LVL3 (GOWN DISPOSABLE) ×2
GOWN STRL REUS W/TWL XL LVL3 (GOWN DISPOSABLE) ×2
KIT TURNOVER KIT A (KITS) ×3 IMPLANT
LABEL OR SOLS (LABEL) ×3 IMPLANT
LOOP RED MAXI  1X406MM (MISCELLANEOUS) ×2
LOOP VESSEL MAXI 1X406 RED (MISCELLANEOUS) ×1 IMPLANT
NDL SAFETY ECLIPSE 18X1.5 (NEEDLE) ×1 IMPLANT
NEEDLE HYPO 18GX1.5 SHARP (NEEDLE)
NS IRRIG 1000ML POUR BTL (IV SOLUTION) ×3 IMPLANT
PACK EXTREMITY ARMC (MISCELLANEOUS) ×3 IMPLANT
PAD CAST CTTN 4X4 STRL (SOFTGOODS) ×2 IMPLANT
PADDING CAST COTTON 4X4 STRL (SOFTGOODS)
SLING ARM LRG DEEP (SOFTGOODS) IMPLANT
SLING ARM M TX990204 (SOFTGOODS) IMPLANT
SPLINT WRIST LG LT TX990309 (SOFTGOODS) IMPLANT
SPLINT WRIST LG RT TX900304 (SOFTGOODS) IMPLANT
SPLINT WRIST M LT TX990308 (SOFTGOODS) IMPLANT
SPLINT WRIST M RT TX990303 (SOFTGOODS) IMPLANT
SPONGE LAP 18X18 RF (DISPOSABLE) ×3 IMPLANT
STAPLER SKIN PROX 35W (STAPLE) ×1 IMPLANT
STOCKINETTE IMPERVIOUS 9X36 MD (GAUZE/BANDAGES/DRESSINGS) ×3 IMPLANT
STRAP SAFETY 5IN WIDE (MISCELLANEOUS) ×3 IMPLANT
STRIP CLOSURE SKIN 1/4X4 (GAUZE/BANDAGES/DRESSINGS) ×2 IMPLANT
SUT PROLENE 4 0 PS 2 18 (SUTURE) ×3 IMPLANT
SUT VIC AB 0 CT2 27 (SUTURE) ×1 IMPLANT
SUT VIC AB 2-0 CT1 (SUTURE) ×1 IMPLANT
SUT VIC AB 4-0 SH 27 (SUTURE)
SUT VIC AB 4-0 SH 27XANBCTRL (SUTURE) ×1 IMPLANT
SUT VICRYL+ 3-0 36IN CT-1 (SUTURE) ×1 IMPLANT
SYR 10ML LL (SYRINGE) ×1 IMPLANT

## 2019-05-14 NOTE — OR Nursing (Signed)
Patient able to move fingers on operative hand, fingers warm to touch and cap refill brisk.

## 2019-05-14 NOTE — Anesthesia Procedure Notes (Signed)
Procedure Name: Intubation Date/Time: 05/14/2019 9:25 AM Performed by: Gentry Fitz, CRNA Pre-anesthesia Checklist: Patient identified, Emergency Drugs available and Suction available Patient Re-evaluated:Patient Re-evaluated prior to induction Oxygen Delivery Method: Circle system utilized Preoxygenation: Pre-oxygenation with 100% oxygen Induction Type: IV induction Ventilation: Mask ventilation without difficulty Laryngoscope Size: Mac, 4, McGraph and 3 Grade View: Grade III Tube type: Oral Number of attempts: 3 Airway Equipment and Method: Stylet Placement Confirmation: ETT inserted through vocal cords under direct vision,  positive ETCO2 and breath sounds checked- equal and bilateral Secured at: 23 cm Tube secured with: Tape Dental Injury: Teeth and Oropharynx as per pre-operative assessment  Difficulty Due To: Difficulty was unanticipated and Difficult Airway- due to large tongue Future Recommendations: Recommend- induction with short-acting agent, and alternative techniques readily available

## 2019-05-14 NOTE — Anesthesia Post-op Follow-up Note (Signed)
Anesthesia QCDR form completed.        

## 2019-05-14 NOTE — Discharge Instructions (Addendum)
AMBULATORY SURGERY  DISCHARGE INSTRUCTIONS   1) The drugs that you were given will stay in your system until tomorrow so for the next 24 hours you should not:  A) Drive an automobile B) Make any legal decisions C) Drink any alcoholic beverage   2) You may resume regular meals tomorrow.  Today it is better to start with liquids and gradually work up to solid foods.  You may eat anything you prefer, but it is better to start with liquids, then soup and crackers, and gradually work up to solid foods.   3) Please notify your doctor immediately if you have any unusual bleeding, trouble breathing, redness and pain at the surgery site, drainage, fever, or pain not relieved by medication.    4) Additional Instructions:        Please contact your physician with any problems or Same Day Surgery at [phone], Monday through Friday 6 am to 4 pm, or [place] [facility] number at [phone].   Orthopedic discharge instructions: Keep dressing dry and intact. Keep hand elevated above heart level. May shower after dressing removed on postop day 4 (Sunday). Cover sutures with Band-Aids after drying off. Apply ice to affected area frequently. Take ibuprofen 600-800 mg TID with meals for 7-10 days, then as necessary. Take ES Tylenol or pain medication as prescribed when needed.  Return for follow-up in 10-14 days or as scheduled.    Norco (hydrocodone-acetaminophen 5-339m tablet taken 05/14/2019 at 10:31am.

## 2019-05-14 NOTE — Op Note (Signed)
05/14/2019  9:54 AM  Patient:   Spencer Heath  Pre-Op Diagnosis:   DeQuervain's tenosynovitis, right wrist.  Post-Op Diagnosis:   Same.  Procedure:   Release of first dorsal compartment, right wrist.  Surgeon:   Pascal Lux, MD  Assistant:   None  Anesthesia:   GET  Findings:   As above.  Complications:   None  EBL:   0 cc  Fluids:   500 cc crystalloid  TT:   13 minutes at 250 mmHg  Drains:   None  Closure:   4-0 Prolene interrupted sutures  Brief Clinical Note:   The patient is a 53 year old male with a 48-monthhistory of radial sided right wrist pain. He denies any history of injury to the wrist, but does perform many repetitive activities at work and enjoys playing video games. His symptoms have persisted despite medications, activity modification, injections, therapy, and splinting. The patient's history and examination are consistent with DeQuervain's tenosynovitis. The patient presents at this time for release of the first dorsal compartment.  Procedure:   The patient was brought into the operating room and lain in the supine position. After adequate IV sedation was achieved, a Bier block was placed by the anesthesiologist and the tourniquet inflated to 250 mmHg. The right hand and upper extremity were prepped with ChloraPrep solution before being draped sterilely. Preoperative antibiotics were administered. After performing a timeout to verify the appropriate surgical site, a 1.5-2 cm incision was made transversely over the first dorsal compartment. The incision was carried down through subcutaneous tissues with care taken to identify and protect the sensory nerves and veins running in this area. The mass was identified and circumferentially dissected out before being removed. The underlying retinaculum was identified. The first dorsal compartment was released from proximal to distal using Metzenbaum scissors. The underlying tendons were carefully inspected and  found to be intact. No additional adhesions were identified.  The wound was copiously irrigated with sterile saline solution before the skin was reapproximated using 4-0 Prolene interrupted sutures. A total of 5 cc of 0.5% plain Sensorcaine was injected in and around the incision to help with postoperative analgesia before a sterile bulky dressing was applied to the wrist. The patient was then awakened and returned to the recovery room in satisfactory condition after tolerating the procedure well.

## 2019-05-14 NOTE — Anesthesia Postprocedure Evaluation (Signed)
Anesthesia Post Note  Patient: Spencer Heath  Procedure(s) Performed: RELEASE DORSAL COMPARTMENT (DEQUERVAIN) (Right )  Patient location during evaluation: PACU Anesthesia Type: General Level of consciousness: awake and alert Pain management: pain level controlled Vital Signs Assessment: post-procedure vital signs reviewed and stable Respiratory status: spontaneous breathing, nonlabored ventilation, respiratory function stable and patient connected to nasal cannula oxygen Cardiovascular status: blood pressure returned to baseline and stable Postop Assessment: no apparent nausea or vomiting Anesthetic complications: no     Last Vitals:  Vitals:   05/14/19 1043 05/14/19 1101  BP: (!) 152/85 (!) 149/87  Pulse:  73  Resp:  18  Temp:  36.6 C  SpO2:  100%    Last Pain:  Vitals:   05/14/19 1101  TempSrc: Temporal  PainSc: 0-No pain                 Martha Clan

## 2019-05-14 NOTE — H&P (Signed)
Paper H&P to be scanned into permanent record. H&P reviewed and patient re-examined. No changes. 

## 2019-05-14 NOTE — Progress Notes (Signed)
Ch visited with pt in pre-op who is a 53 YOM that is here related to complications caused by carpel tunnel syndrome as reported by pt. Ch provided social support while pt awaited assessment from anesthesiologist. Pt presented to have a positive affect and did not have any major concerns about the procedure. Pt shared that he is sometimes awaken by pain at night that shoots all the way up his R arm in addition to the numbness and tingling that he would experience throughout the day. Pt has been to ~7 sessions of therapy yet surgery would be the best option for him long term. Pt is supported by his wife that is in the waiting room area.  Ch visit was appreciated.    05/14/19 0800  Clinical Encounter Type  Visited With Patient  Visit Type Spiritual support;Social support;Pre-op  Spiritual Encounters  Spiritual Needs Emotional;Grief support  Stress Factors  Patient Stress Factors Health changes  Family Stress Factors None identified

## 2019-05-14 NOTE — Transfer of Care (Signed)
Immediate Anesthesia Transfer of Care Note  Patient: Spencer Heath  Procedure(s) Performed: RELEASE DORSAL COMPARTMENT (DEQUERVAIN) (Right )  Patient Location: PACU  Anesthesia Type:General  Level of Consciousness: awake, alert , drowsy and patient cooperative  Airway & Oxygen Therapy: Patient Spontanous Breathing and Patient connected to face mask oxygen  Post-op Assessment: Report given to RN and Post -op Vital signs reviewed and stable  Post vital signs: Reviewed and stable  Last Vitals:  Vitals Value Taken Time  BP    Temp    Pulse 82 05/14/19 1005  Resp 12 05/14/19 1005  SpO2 98 % 05/14/19 1005  Vitals shown include unvalidated device data.  Last Pain:  Vitals:   05/14/19 1002  TempSrc:   PainSc: 0-No pain         Complications: No apparent anesthesia complications

## 2019-05-14 NOTE — Anesthesia Preprocedure Evaluation (Signed)
Anesthesia Evaluation  Patient identified by MRN, date of birth, ID band Patient awake    Reviewed: Allergy & Precautions, H&P , NPO status , Patient's Chart, lab work & pertinent test results, reviewed documented beta blocker date and time   History of Anesthesia Complications Negative for: history of anesthetic complications  Airway Mallampati: III  TM Distance: >3 FB Neck ROM: full    Dental  (+) Dental Advidsory Given, Teeth Intact   Pulmonary neg shortness of breath, sleep apnea , neg COPD, neg recent URI,    Pulmonary exam normal        Cardiovascular Exercise Tolerance: Good hypertension, (-) angina(-) Past MI and (-) Cardiac Stents Normal cardiovascular exam(-) dysrhythmias (-) Valvular Problems/Murmurs     Neuro/Psych negative neurological ROS  negative psych ROS   GI/Hepatic Neg liver ROS, GERD  ,  Endo/Other  negative endocrine ROS  Renal/GU negative Renal ROS  negative genitourinary   Musculoskeletal   Abdominal   Peds  Hematology negative hematology ROS (+)   Anesthesia Other Findings Past Medical History: No date: Arthritis No date: Crohn's disease ([facility]) No date: GERD (gastroesophageal reflux disease) No date: Hypertension   Reproductive/Obstetrics negative OB ROS                             Anesthesia Physical Anesthesia Plan  ASA: II  Anesthesia Plan: General   Post-op Pain Management:    Induction: Intravenous  PONV Risk Score and Plan: 2 and Ondansetron, Dexamethasone, Midazolam, Promethazine and Treatment may vary due to age or medical condition  Airway Management Planned: LMA and Oral ETT  Additional Equipment:   Intra-op Plan:   Post-operative Plan: Extubation in OR  Informed Consent: I have reviewed the patients History and Physical, chart, labs and discussed the procedure including the risks, benefits and alternatives for the proposed anesthesia  with the patient or authorized representative who has indicated his/her understanding and acceptance.     Dental Advisory Given  Plan Discussed with: Anesthesiologist, CRNA and Surgeon  Anesthesia Plan Comments:         Anesthesia Quick Evaluation

## 2019-07-05 ENCOUNTER — Emergency Department: Payer: 59

## 2019-07-05 ENCOUNTER — Encounter: Payer: Self-pay | Admitting: Emergency Medicine

## 2019-07-05 ENCOUNTER — Other Ambulatory Visit: Payer: Self-pay

## 2019-07-05 ENCOUNTER — Emergency Department
Admission: EM | Admit: 2019-07-05 | Discharge: 2019-07-05 | Disposition: A | Discharge status: Home / Self Care | Payer: 59 | Attending: Emergency Medicine | Admitting: Emergency Medicine

## 2019-07-05 DIAGNOSIS — I10 Essential (primary) hypertension: Secondary | ICD-10-CM | POA: Insufficient documentation

## 2019-07-05 DIAGNOSIS — Z79899 Other long term (current) drug therapy: Secondary | ICD-10-CM | POA: Insufficient documentation

## 2019-07-05 DIAGNOSIS — J069 Acute upper respiratory infection, unspecified: Secondary | ICD-10-CM | POA: Diagnosis not present

## 2019-07-05 DIAGNOSIS — Z20822 Contact with and (suspected) exposure to covid-19: Secondary | ICD-10-CM | POA: Diagnosis not present

## 2019-07-05 DIAGNOSIS — R0602 Shortness of breath: Secondary | ICD-10-CM | POA: Diagnosis present

## 2019-07-05 LAB — SARS CORONAVIRUS 2 (TAT 6-24 HRS): SARS Coronavirus 2: NEGATIVE

## 2019-07-05 MED ORDER — BENZONATATE 100 MG PO CAPS: 200.0000 mg | ORAL_CAPSULE | Freq: Three times a day (TID) | ORAL | 0 refills | Status: AC | PRN

## 2019-07-05 MED ORDER — KETOROLAC TROMETHAMINE 10 MG PO TABS: 10.0000 mg | ORAL_TABLET | Freq: Four times a day (QID) | ORAL | 0 refills | Status: DC | PRN

## 2019-07-05 MED ORDER — KETOROLAC TROMETHAMINE 60 MG/2ML IM SOLN
60.0000 mg | Freq: Once | INTRAMUSCULAR | Status: AC
  Administered 2019-07-05: 60 mg via INTRAMUSCULAR
  Filled 2019-07-05: qty 2

## 2019-07-05 MED ORDER — FEXOFENADINE-PSEUDOEPHED ER 60-120 MG PO TB12: 1.0000 | ORAL_TABLET | Freq: Two times a day (BID) | ORAL | 0 refills | Status: AC

## 2019-07-05 NOTE — ED Provider Notes (Signed)
[facility] Emergency Department Provider Note   ____________________________________________   First MD Initiated Contact with Patient 07/05/19 1424     (approximate)  I have reviewed the triage vital signs and the nursing notes.   HISTORY  Chief Complaint Nasal Congestion and URI    HPI Spencer Heath is a 54 y.o. male patient presents with 2 weeks of URI symptoms consistent intermittent shortness of breath and productive cough.  Patient state mild chest discomfort.  Patient denies fever but still has chills.  Patient states this morning awakened increased fatigue and body aches.  Patient denies loss of taste or smell.  Patient was seen by PCP earlier this week given to allergy medicines.  He said he continued had a runny nose and occasional cough.  Patient had a negative Covid test 2 weeks ago.  Patient denies recent travel.  Patient had positive contact with relatives 2 days ago who was diagnosed with Covid.  Patient rates his pain discomfort a 3/10.  Patient described pain/discomfort as "achy".  No other palliative measure for complaint.         Past Medical History:  Diagnosis Date  . Arthritis   . Crohn's disease ([place])   . GERD (gastroesophageal reflux disease)   . Hypertension     Patient Active Problem List   Diagnosis Date Noted  . Small bowel obstruction ([place]) 01/24/2015  . Crohn's disease ([place]) 01/24/2015    Past Surgical History:  Procedure Laterality Date  . ABDOMINAL SURGERY    . APPENDECTOMY    . DORSAL COMPARTMENT RELEASE Right 05/14/2019   Procedure: RELEASE DORSAL COMPARTMENT (DEQUERVAIN);  Surgeon: Corky Mull, MD;  Location: [facility] ORS;  Service: Orthopedics;  Laterality: Right;  . testical removed    . TESTICLE SURGERY      Prior to Admission medications   Medication Sig Start Date End Date Taking? Authorizing Provider  amLODipine (NORVASC) 5 MG tablet Take 10 mg by mouth daily.  08/31/14   [provider]    azaTHIOprine (IMURAN) 50 MG tablet Take 50 mg by mouth 4 (four) times daily. 07/21/16   [provider]  benzonatate (TESSALON PERLES) 100 MG capsule Take 2 capsules (200 mg total) by mouth 3 (three) times daily as needed. 07/05/19 07/04/20  Sable Feil, PA-C  fexofenadine-pseudoephedrine (ALLEGRA-D) 60-120 MG 12 hr tablet Take 1 tablet by mouth 2 (two) times daily. 07/05/19   Sable Feil, PA-C  hydrochlorothiazide (HYDRODIURIL) 25 MG tablet Take 25 mg by mouth daily.  07/09/14 05/14/19  [provider]  inFLIXimab (REMICADE) 100 MG injection Inject 100 mg into the vein every 8 (eight) weeks.    [provider]  ketorolac (TORADOL) 10 MG tablet Take 1 tablet (10 mg total) by mouth every 6 (six) hours as needed. 07/05/19   Sable Feil, PA-C  pantoprazole (PROTONIX) 40 MG tablet Take 40 mg by mouth daily.    [provider]    Allergies Bee venom and Ivp dye [iodinated diagnostic agents]  Family History  Problem Relation Age of Onset  . Breast cancer Maternal Aunt   . Breast cancer Cousin     Social History Social History   Tobacco Use  . Smoking status: Never Smoker  . Smokeless tobacco: Never Used  Substance Use Topics  . Alcohol use: No  . Drug use: No    Review of Systems Constitutional: No fever/chills Eyes: No visual changes. ENT: No sore throat. Cardiovascular: Denies chest pain. Respiratory:  Denies shortness of breath. Gastrointestinal: No abdominal pain.  No nausea, no vomiting.  No diarrhea.  No constipation.  History of GERD and Crohn's disease. Genitourinary: Negative for dysuria. Musculoskeletal: Negative for back pain. Skin: Negative for rash. Neurological: Negative for headaches, focal weakness or numbness. Endocrine:  Hypertension Allergic/Immunilogical: Bee venom and an IVP dye. ____________________________________________   PHYSICAL EXAM:  VITAL SIGNS: ED Triage Vitals [07/05/19 1420]  Enc Vitals Group     BP       Pulse      Resp      Temp      Temp src      SpO2      Weight 230 lb (104.3 kg)     Height 5' 5"  (1.651 m)     Head Circumference      Peak Flow      Pain Score 3     Pain Loc      Pain Edu?      Excl. in [place]?    Constitutional: Alert and oriented. Well appearing and in no acute distress. Eyes: Conjunctivae are normal. PERRL. EOMI. Head: Atraumatic. Nose: Edematous nasal turbinates clear rhinorrhea. Mouth/Throat: Mucous membranes are moist.  Oropharynx non-erythematous.  Postnasal drainage. Neck: No stridor.  Hematological/Lymphatic/Immunilogical: No cervical lymphadenopathy. Cardiovascular: Normal rate, regular rhythm. Grossly normal heart sounds.  Good peripheral circulation. Respiratory: Normal respiratory effort.  No retractions. Lungs CTAB. Gastrointestinal: Soft and nontender. No distention. No abdominal bruits. No CVA tenderness.  Skin:  Skin is warm, dry and intact. No rash noted. Psychiatric: Mood and affect are normal. Speech and behavior are normal.  ____________________________________________   LABS (all labs ordered are listed, but only abnormal results are displayed)  Labs Reviewed  SARS CORONAVIRUS 2 (TAT 6-24 HRS)   ____________________________________________  EKG   ____________________________________________  RADIOLOGY  ED MD interpretation:    Official radiology report(s): DG Chest Portable 1 View  Result Date: 07/05/2019 CLINICAL DATA:  Two weeks cough EXAM: PORTABLE CHEST 1 VIEW COMPARISON:  04/15/2019 chest radiograph FINDINGS: The heart size and mediastinal contours are within normal limits. Both lungs are clear. The visualized skeletal structures are unremarkable. IMPRESSION: No active disease. Electronically Signed   By: Ulyses Jarred M.D.   On: 07/05/2019 15:16    ____________________________________________   PROCEDURES  Procedure(s) performed (including Critical  Care):  Procedures   ____________________________________________   INITIAL IMPRESSION / ASSESSMENT AND PLAN / ED COURSE  As part of my medical decision making, I reviewed the following data within the [place]     Patient presents with 2 weeks of URI signs and symptoms.  Discussed negative chest x-ray findings with patient.  Patient physical exam is consistent with viral respiratory infection.  Advised self quarantine pending results of COVID-19 test.  Take medication as directed.  If test is positive must quarantine for additional 10 days.    Mattew ESAW KNIPPEL was evaluated in Emergency Department on 07/05/2019 for the symptoms described in the history of present illness. He was evaluated in the context of the global COVID-19 pandemic, which necessitated consideration that the patient might be at risk for infection with the SARS-CoV-2 virus that causes COVID-19. Institutional protocols and algorithms that pertain to the evaluation of patients at risk for COVID-19 are in a state of rapid change based on information released by regulatory bodies including the CDC and federal and state organizations. These policies and algorithms were followed during the patient's care in the ED.  ____________________________________________   FINAL CLINICAL IMPRESSION(S) / ED DIAGNOSES  Final diagnoses:  Viral URI with cough     ED Discharge Orders         Ordered    fexofenadine-pseudoephedrine (ALLEGRA-D) 60-120 MG 12 hr tablet  2 times daily     07/05/19 1525    benzonatate (TESSALON PERLES) 100 MG capsule  3 times daily PRN     07/05/19 1525    ketorolac (TORADOL) 10 MG tablet  Every 6 hours PRN     07/05/19 1525           Note:  This document was prepared using Dragon voice recognition software and may include unintentional dictation errors.    Sable Feil, PA-C 07/05/19 1527    Carrie Mew, MD 07/06/19 [phone]

## 2019-07-05 NOTE — ED Triage Notes (Signed)
Presents with a 2 week hx of cold sxs' with intermittent SOB and chest discomfort   Denies any fever but did have chills last pm  States he was seen by PCP this week  Given 2 allergy meds at that time    States he is also having runny nose and occasional cough  States he had negative COVID test 3 weeks ago.

## 2019-07-05 NOTE — Discharge Instructions (Signed)
Follow discharge care instruction take medication as directed.  Advised self quarantine pending results of COVID-19 test.  If test is positive must quarantine for additional 10 days.

## 2019-07-25 ENCOUNTER — Other Ambulatory Visit: Payer: Self-pay

## 2019-07-25 ENCOUNTER — Other Ambulatory Visit
Admission: RE | Admit: 2019-07-25 | Discharge: 2019-07-25 | Disposition: A | Discharge status: Home / Self Care | Payer: 59 | Source: Ambulatory Visit | Attending: Otolaryngology | Admitting: Otolaryngology

## 2019-07-25 DIAGNOSIS — Z01812 Encounter for preprocedural laboratory examination: Secondary | ICD-10-CM | POA: Insufficient documentation

## 2019-07-25 DIAGNOSIS — Z20822 Contact with and (suspected) exposure to covid-19: Secondary | ICD-10-CM | POA: Insufficient documentation

## 2019-07-26 LAB — SARS CORONAVIRUS 2 (TAT 6-24 HRS): SARS Coronavirus 2: NEGATIVE

## 2019-09-03 ENCOUNTER — Other Ambulatory Visit: Payer: Self-pay

## 2019-09-03 ENCOUNTER — Emergency Department
Admission: EM | Admit: 2019-09-03 | Discharge: 2019-09-04 | Disposition: A | Discharge status: Home / Self Care | Payer: BC Managed Care – PPO | Attending: Emergency Medicine | Admitting: Emergency Medicine

## 2019-09-03 ENCOUNTER — Emergency Department: Payer: BC Managed Care – PPO

## 2019-09-03 ENCOUNTER — Encounter: Payer: Self-pay | Admitting: Emergency Medicine

## 2019-09-03 DIAGNOSIS — E876 Hypokalemia: Secondary | ICD-10-CM | POA: Insufficient documentation

## 2019-09-03 DIAGNOSIS — R0789 Other chest pain: Secondary | ICD-10-CM | POA: Insufficient documentation

## 2019-09-03 DIAGNOSIS — R200 Anesthesia of skin: Secondary | ICD-10-CM | POA: Diagnosis present

## 2019-09-03 DIAGNOSIS — Z79899 Other long term (current) drug therapy: Secondary | ICD-10-CM | POA: Diagnosis not present

## 2019-09-03 DIAGNOSIS — R202 Paresthesia of skin: Secondary | ICD-10-CM | POA: Diagnosis not present

## 2019-09-03 DIAGNOSIS — R079 Chest pain, unspecified: Secondary | ICD-10-CM

## 2019-09-03 DIAGNOSIS — I10 Essential (primary) hypertension: Secondary | ICD-10-CM | POA: Diagnosis not present

## 2019-09-03 LAB — COMPREHENSIVE METABOLIC PANEL
ALT: 23 U/L (ref 0–44)
AST: 18 U/L (ref 15–41)
Albumin: 3.9 g/dL (ref 3.5–5.0)
Alkaline Phosphatase: 124 U/L (ref 38–126)
Anion gap: 7 (ref 5–15)
BUN: 17 mg/dL (ref 6–20)
CO2: 34 mmol/L — ABNORMAL HIGH (ref 22–32)
Calcium: 8.9 mg/dL (ref 8.9–10.3)
Chloride: 96 mmol/L — ABNORMAL LOW (ref 98–111)
Creatinine, Ser: 1.06 mg/dL (ref 0.61–1.24)
GFR calc Af Amer: >60 mL/min (ref >60)
GFR calc non Af Amer: >60 mL/min (ref >60)
Glucose, Bld: 147 mg/dL — ABNORMAL HIGH (ref 70–99)
Potassium: 2.6 mmol/L — CL (ref 3.5–5.1)
Sodium: 137 mmol/L (ref 135–145)
Total Bilirubin: 0.9 mg/dL (ref 0.3–1.2)
Total Protein: 6.9 g/dL (ref 6.5–8.1)

## 2019-09-03 LAB — CBC WITH DIFFERENTIAL/PLATELET
Abs Immature Granulocytes: 0.06 10*3/uL (ref 0.00–0.07)
Basophils Absolute: 0.1 10*3/uL (ref 0.0–0.1)
Basophils Relative: 1 %
Eosinophils Absolute: 0.3 10*3/uL (ref 0.0–0.5)
Eosinophils Relative: 4 %
HCT: 37.8 % — ABNORMAL LOW (ref 39.0–52.0)
Hemoglobin: 12.7 g/dL — ABNORMAL LOW (ref 13.0–17.0)
Immature Granulocytes: 1 %
Lymphocytes Relative: 19 %
Lymphs Abs: 1.8 10*3/uL (ref 0.7–4.0)
MCH: 27.4 pg (ref 26.0–34.0)
MCHC: 33.6 g/dL (ref 30.0–36.0)
MCV: 81.5 fL (ref 80.0–100.0)
Monocytes Absolute: 0.6 10*3/uL (ref 0.1–1.0)
Monocytes Relative: 6 %
Neutro Abs: 6.6 10*3/uL (ref 1.7–7.7)
Neutrophils Relative %: 69 %
Platelets: 342 10*3/uL (ref 150–400)
RBC: 4.64 MIL/uL (ref 4.22–5.81)
RDW: 14.2 % (ref 11.5–15.5)
WBC: 9.3 10*3/uL (ref 4.0–10.5)
nRBC: 0 % (ref 0.0–0.2)

## 2019-09-03 LAB — TROPONIN I (HIGH SENSITIVITY): Troponin I (High Sensitivity): 9 ng/L (ref <18)

## 2019-09-03 MED ORDER — POTASSIUM CHLORIDE 10 MEQ/100ML IV SOLN
10.0000 meq | INTRAVENOUS | Status: AC
  Administered 2019-09-03 – 2019-09-04 (×2): 10 meq via INTRAVENOUS
  Filled 2019-09-03 (×2): qty 100

## 2019-09-03 MED ORDER — POTASSIUM CHLORIDE CRYS ER 20 MEQ PO TBCR
40.0000 meq | EXTENDED_RELEASE_TABLET | Freq: Once | ORAL | Status: AC
  Administered 2019-09-03: 40 meq via ORAL
  Filled 2019-09-03: qty 2

## 2019-09-03 NOTE — ED Triage Notes (Signed)
Patient ambulatory to triage with steady gait, without difficulty or distress noted, mask in place; pt reports since yesterday has had numbness to "whole face" and swelling to legs and face with some discomfort to left side of chest; st similar episode in past with no findings; currently taking antibiotic for sinus infection

## 2019-09-03 NOTE — ED Notes (Signed)
Reviewed pt's cc with Dr Joni Fears, no further orders given at this time

## 2019-09-04 ENCOUNTER — Emergency Department: Payer: BC Managed Care – PPO

## 2019-09-04 LAB — MAGNESIUM: Magnesium: 2.5 mg/dL — ABNORMAL HIGH (ref 1.7–2.4)

## 2019-09-04 LAB — TROPONIN I (HIGH SENSITIVITY): Troponin I (High Sensitivity): 10 ng/L (ref <18)

## 2019-09-04 MED ORDER — POTASSIUM CHLORIDE ER 10 MEQ PO TBCR: 10.0000 meq | EXTENDED_RELEASE_TABLET | Freq: Every day | ORAL | 1 refills | Status: AC

## 2019-09-04 NOTE — Discharge Instructions (Signed)
Take potassium once daily as prescribed.  Follow-up with your cardiologist on Friday.  Follow-up with your primary care doctor in a couple of days.  Return to the emergency room for new or worsening chest pain, shortness of breath or dizziness.

## 2019-09-04 NOTE — ED Provider Notes (Signed)
[facility] Emergency Department Provider Note  ____________________________________________  Time seen: Approximately 12:00 AM  I have reviewed the triage vital signs and the nursing notes.   HISTORY  Chief Complaint Numbness   HPI Spencer Heath is a 54 y.o. male with history of Crohn's disease on Imuran and Remicade, hypertension, elevated BMI, GERD who presents for evaluation of numbness.  Patient reports intermittent facial and bilateral hand numbness and tingling.  He reports the numbness is on both sides of his face and both hands.  He has been having to remittent episodes for the last month but today his whole face became numb which prompted visit to the emergency room.  He also reports that he feels like both of his legs are swollen.  Feels like his uniform at work is tighter than normal.  Also complaining of intermittent tightness in his chest that has been ongoing for the last month.  Underwent a stress test in the beginning of the week with Dr. Saralyn Pilar.  He is supposed to get the results tomorrow. No prior history of cardiac disease.  He is on hydrochlorothiazide but has been on it for several years.  He has had normal urine output.  No chest pain at this time.  No shortness of breath or cough, no fever or chills, no vomiting or diarrhea.  Past Medical History:  Diagnosis Date  . Arthritis   . Crohn's disease ([place])   . GERD (gastroesophageal reflux disease)   . Hypertension     Patient Active Problem List   Diagnosis Date Noted  . Small bowel obstruction ([place]) 01/24/2015  . Crohn's disease ([place]) 01/24/2015    Past Surgical History:  Procedure Laterality Date  . ABDOMINAL SURGERY    . APPENDECTOMY    . DORSAL COMPARTMENT RELEASE Right 05/14/2019   Procedure: RELEASE DORSAL COMPARTMENT (DEQUERVAIN);  Surgeon: Corky Mull, MD;  Location: [facility] ORS;  Service: Orthopedics;  Laterality: Right;  . testical removed    . TESTICLE SURGERY       Prior to Admission medications   Medication Sig Start Date End Date Taking? Authorizing Provider  amLODipine (NORVASC) 5 MG tablet Take 10 mg by mouth daily.  08/31/14   [provider]  azaTHIOprine (IMURAN) 50 MG tablet Take 50 mg by mouth 4 (four) times daily. 07/21/16   [provider]  benzonatate (TESSALON PERLES) 100 MG capsule Take 2 capsules (200 mg total) by mouth 3 (three) times daily as needed. 07/05/19 07/04/20  Sable Feil, PA-C  fexofenadine-pseudoephedrine (ALLEGRA-D) 60-120 MG 12 hr tablet Take 1 tablet by mouth 2 (two) times daily. 07/05/19   Sable Feil, PA-C  hydrochlorothiazide (HYDRODIURIL) 25 MG tablet Take 25 mg by mouth daily.  07/09/14 05/14/19  [provider]  inFLIXimab (REMICADE) 100 MG injection Inject 100 mg into the vein every 8 (eight) weeks.    [provider]  ketorolac (TORADOL) 10 MG tablet Take 1 tablet (10 mg total) by mouth every 6 (six) hours as needed. 07/05/19   Sable Feil, PA-C  pantoprazole (PROTONIX) 40 MG tablet Take 40 mg by mouth daily.    [provider]  potassium chloride (KLOR-CON) 10 MEQ tablet Take 1 tablet (10 mEq total) by mouth daily. 09/04/19   Rudene Re, MD    Allergies Bee venom and Ivp dye [iodinated diagnostic agents]  Family History  Problem Relation Age of Onset  . Breast cancer Maternal Aunt   . Breast cancer Cousin  Social History Social History   Tobacco Use  . Smoking status: Never Smoker  . Smokeless tobacco: Never Used  Substance Use Topics  . Alcohol use: No  . Drug use: No    Review of Systems  Constitutional: Negative for fever. Eyes: Negative for visual changes. ENT: Negative for sore throat. Neck: No neck pain  Cardiovascular: Negative for chest pain. + chest tightness Respiratory: Negative for shortness of breath. Gastrointestinal: Negative for abdominal pain, vomiting or diarrhea. Genitourinary: Negative for  dysuria. Musculoskeletal: Negative for back pain. + b/l leg swelling Skin: Negative for rash. Neurological: Negative for headaches. + b/l hand and facial numbness and tingling. Psych: No SI or HI  ____________________________________________   PHYSICAL EXAM:  VITAL SIGNS: ED Triage Vitals [09/03/19 1912]  Enc Vitals Group     BP (!) 161/87     Pulse Rate 84     Resp 18     Temp 98 F (36.7 C)     Temp Source Oral     SpO2 97 %     Weight 235 lb (106.6 kg)     Height 5' 5"  (1.651 m)     Head Circumference      Peak Flow      Pain Score 0     Pain Loc      Pain Edu?      Excl. in [place]?     Constitutional: Alert and oriented. Well appearing and in no apparent distress. HEENT:      Head: Normocephalic and atraumatic.         Eyes: Conjunctivae are normal. Sclera is non-icteric.       Mouth/Throat: Mucous membranes are moist.       Neck: Supple with no signs of meningismus. Cardiovascular: Regular rate and rhythm. No murmurs, gallops, or rubs. 2+ symmetrical distal pulses are present in all extremities. No JVD. Respiratory: Normal respiratory effort. Lungs are clear to auscultation bilaterally. No wheezes, crackles, or rhonchi.  Gastrointestinal: Soft, non tender, and non distended Musculoskeletal: No edema, cyanosis, or erythema of extremities. Neurologic: Normal speech and language. Face is symmetric.  Normal gait, intact strength and sensation on the face and x4 Skin: Skin is warm, dry and intact. No rash noted. Psychiatric: Mood and affect are normal. Speech and behavior are normal.  ____________________________________________   LABS (all labs ordered are listed, but only abnormal results are displayed)  Labs Reviewed  CBC WITH DIFFERENTIAL/PLATELET - Abnormal; Notable for the following components:      Result Value   Hemoglobin 12.7 (*)    HCT 37.8 (*)    All other components within normal limits  COMPREHENSIVE METABOLIC PANEL - Abnormal; Notable for the  following components:   Potassium 2.6 (*)    Chloride 96 (*)    CO2 34 (*)    Glucose, Bld 147 (*)    All other components within normal limits  MAGNESIUM - Abnormal; Notable for the following components:   Magnesium 2.5 (*)    All other components within normal limits  TROPONIN I (HIGH SENSITIVITY)  TROPONIN I (HIGH SENSITIVITY)   ____________________________________________  EKG  ED ECG REPORT I, Rudene Re, the attending physician, personally viewed and interpreted this ECG.  Normal sinus rhythm with frequent PACs, rate of 81, normal intervals, normal axis, T wave inversions in lateral leads with no ST elevation.  T WI are new when compared to prior. ____________________________________________  RADIOLOGY  I have personally reviewed the images performed during this visit and I  agree with the Radiologist's read.   Interpretation by Radiologist:  DG Chest 2 View  Result Date: 09/03/2019 CLINICAL DATA:  Chest pain EXAM: CHEST - 2 VIEW COMPARISON:  07/05/2019 FINDINGS: The heart size and mediastinal contours are within normal limits. Both lungs are clear. Degenerative changes in the thoracic spine. No acute bony abnormality. IMPRESSION: No acute cardiopulmonary disease. Electronically Signed   By: Rolm Baptise M.D.   On: 09/03/2019 19:28   CT Head Wo Contrast  Result Date: 09/04/2019 CLINICAL DATA:  Numbness and tingling in face. EXAM: CT HEAD WITHOUT CONTRAST TECHNIQUE: Contiguous axial images were obtained from the base of the skull through the vertex without intravenous contrast. COMPARISON:  12/10/2014 FINDINGS: Brain: Mild small vessel disease throughout the deep white matter. No acute intracranial abnormality. Specifically, no hemorrhage, hydrocephalus, mass lesion, acute infarction, or significant intracranial injury. Vascular: No hyperdense vessel or unexpected calcification. Skull: No acute calvarial abnormality. Sinuses/Orbits: Visualized paranasal sinuses and  mastoids clear. Orbital soft tissues unremarkable. Other: None IMPRESSION: Mild chronic small vessel disease. No acute intracranial abnormality. Electronically Signed   By: Rolm Baptise M.D.   On: 09/04/2019 00:36   US Venous Img Lower Bilateral  Result Date: 09/04/2019 CLINICAL DATA:  Leg swelling EXAM: BILATERAL LOWER EXTREMITY VENOUS DOPPLER ULTRASOUND TECHNIQUE: Gray-scale sonography with compression, as well as color and duplex ultrasound, were performed to evaluate the deep venous system(s) from the level of the common femoral vein through the popliteal and proximal calf veins. COMPARISON:  None. FINDINGS: VENOUS Normal compressibility of the common femoral, superficial femoral, and popliteal veins, as well as the visualized calf veins. Visualized portions of profunda femoral vein and great saphenous vein unremarkable. No filling defects to suggest DVT on grayscale or color Doppler imaging. Doppler waveforms show normal direction of venous flow, normal respiratory phasicity and response to augmentation. OTHER None. Limitations: none IMPRESSION: No evidence of lower extremity DVT. Electronically Signed   By: Rolm Baptise M.D.   On: 09/04/2019 01:38     ____________________________________________   PROCEDURES  Procedure(s) performed:yes .1-3 Lead EKG Interpretation Performed by: Rudene Re, MD Authorized by: Rudene Re, MD     Interpretation: abnormal     ECG rate:  80   ECG rate assessment: normal     Rhythm: sinus rhythm     Ectopy: PAC     Conduction: normal     Critical Care performed:  None ____________________________________________   INITIAL IMPRESSION / ASSESSMENT AND PLAN / ED COURSE   54 y.o. male with history of Crohn's disease on Imuran and Remicade, hypertension, elevated BMI, GERD who presents for evaluation of intermittent chest tightness, bilateral hands and facial numbness and tingling, bilateral leg swelling.  Patient is well-appearing in no  distress with normal vital signs, completely normal neurological exam, bilateral lower extremities with no pitting edema, clear lungs, heart regular rate and rhythm.  With bilateral symptoms and a complete neurological exam at this time low suspicion for stroke.  His labs show significant hypokalemia which could be the cause of patient's paresthesias and numbness.  Patient hydrochlorothiazide which could be the culprit.  Will check magnesium and supplement K accordingly via IV and PO routes. Will monitor closely on telemetry for any signs of dysrhythmias.  No EKG changes of hypokalemia with normal intervals. No leg swelling, no prior h/o DVT or PE, no leg pain, no tachypnea, tachycardia or hypoxia and intermittent chest tightness therefore low suspicion for PE.  Chest x-ray visualized by me with no evidence of  edema, pneumonia, or any other acute abnormality which was confirmed by radiology.  EKG is nonischemic and high-sensitivity troponin x2 -.  Old medical records visualized.  _________________________ 1:54 AM on 09/04/2019 -----------------------------------------  Normal magnesium.  Potassium supplemented p.o. and IV for a couple of hours.  Patient symptoms have improved.  Doppler studies of bilateral lower extremities and no evidence of DVT.  Head CT visualized and read by me as no acute findings which was confirmed by radiology.  Recommended follow-up with his cardiologist.  Will start patient on potassium supplementation at home.  Discussed my standard return precautions.     _____________________________________________ Please note:  Patient was evaluated in Emergency Department today for the symptoms described in the history of present illness. Patient was evaluated in the context of the global COVID-19 pandemic, which necessitated consideration that the patient might be at risk for infection with the SARS-CoV-2 virus that causes COVID-19. Institutional protocols and algorithms that pertain  to the evaluation of patients at risk for COVID-19 are in a state of rapid change based on information released by regulatory bodies including the CDC and federal and state organizations. These policies and algorithms were followed during the patient's care in the ED.  Some ED evaluations and interventions may be delayed as a result of limited staffing during the pandemic.   [place] Controlled Substance Database was reviewed by me. ____________________________________________   FINAL CLINICAL IMPRESSION(S) / ED DIAGNOSES   Final diagnoses:  Hypokalemia  Paresthesia  Chest pain, unspecified type      NEW MEDICATIONS STARTED DURING THIS VISIT:  ED Discharge Orders         Ordered    potassium chloride (KLOR-CON) 10 MEQ tablet  Daily     09/04/19 0157           Note:  This document was prepared using Dragon voice recognition software and may include unintentional dictation errors.    Alfred Levins, [place], MD 09/04/19 [phone]

## 2019-09-04 NOTE — ED Notes (Addendum)
Patient transported to CT 

## 2019-09-04 NOTE — ED Notes (Signed)
US at bedside

## 2020-08-31 ENCOUNTER — Ambulatory Visit: Payer: BC Managed Care – PPO | Attending: Otolaryngology

## 2020-08-31 DIAGNOSIS — G4733 Obstructive sleep apnea (adult) (pediatric): Secondary | ICD-10-CM | POA: Insufficient documentation

## 2020-09-01 ENCOUNTER — Other Ambulatory Visit: Payer: Self-pay

## 2021-02-18 ENCOUNTER — Other Ambulatory Visit: Payer: Self-pay

## 2021-02-18 MED ORDER — AMLODIPINE BESYLATE 10 MG PO TABS
10.0000 mg | ORAL_TABLET | Freq: Every day | ORAL | 1 refills | Status: DC
  Filled 2021-02-18: qty 90, 90d supply, fill #0
  Filled 2021-05-23: qty 90, 90d supply, fill #1

## 2021-02-21 ENCOUNTER — Other Ambulatory Visit: Payer: Self-pay

## 2021-02-22 ENCOUNTER — Other Ambulatory Visit: Payer: Self-pay

## 2021-02-23 ENCOUNTER — Other Ambulatory Visit: Payer: Self-pay

## 2021-02-23 MED ORDER — PANTOPRAZOLE SODIUM 40 MG PO TBEC
DELAYED_RELEASE_TABLET | ORAL | 1 refills | Status: AC
  Filled 2021-02-23: qty 30, 30d supply, fill #0

## 2021-02-23 MED ORDER — LOSARTAN POTASSIUM 50 MG PO TABS
50.0000 mg | ORAL_TABLET | Freq: Every day | ORAL | 1 refills | Status: DC
  Filled 2021-02-23 – 2021-03-08 (×2): qty 90, 90d supply, fill #0
  Filled 2021-06-06: qty 90, 90d supply, fill #1

## 2021-02-23 MED ORDER — POTASSIUM CHLORIDE CRYS ER 20 MEQ PO TBCR
20.0000 meq | EXTENDED_RELEASE_TABLET | Freq: Two times a day (BID) | ORAL | 3 refills | Status: DC
  Filled 2021-02-23: qty 60, 30d supply, fill #0
  Filled 2021-03-25: qty 60, 30d supply, fill #1
  Filled 2021-05-03: qty 60, 30d supply, fill #2
  Filled 2021-06-06: qty 60, 30d supply, fill #3
  Filled 2021-07-08: qty 60, 30d supply, fill #4
  Filled 2021-08-15: qty 60, 30d supply, fill #5

## 2021-03-08 ENCOUNTER — Other Ambulatory Visit: Payer: Self-pay

## 2021-03-25 ENCOUNTER — Other Ambulatory Visit: Payer: Self-pay

## 2021-04-29 IMAGING — CT CT RENAL STONE PROTOCOL
2 of 5 series · 16 of 46 positions shown, 18 images · non-contrast
Comparison: 01/24/2015

CLINICAL DATA: Hematuria beginning yesterday. Dysuria. Evaluate for
nephrolithiasis.

EXAM:
CT ABDOMEN AND PELVIS WITHOUT CONTRAST
TECHNIQUE: Multidetector CT imaging of the abdomen and pelvis was performed
following the standard protocol without IV contrast.

[Series 2: stone full standard · axial · 0.86mm/px · z∈[-476,-96]mm · 13 of 84 slices shown, 15 images]
[im 4/84  soft-tissue]
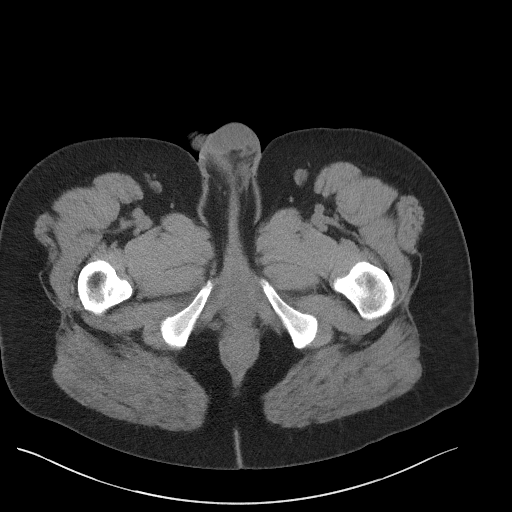
[im 4/84  bone]
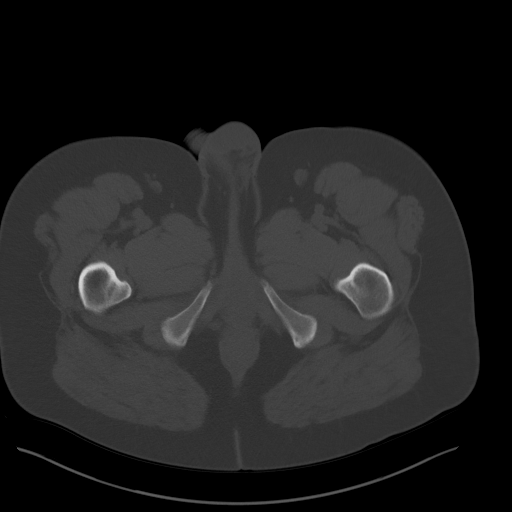
[im 12/84  soft-tissue]
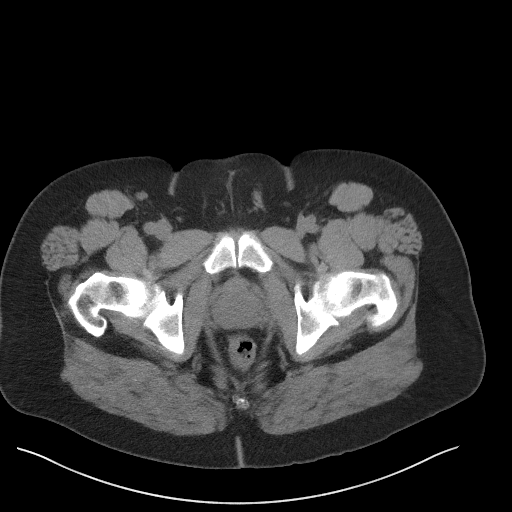
[im 16/84  soft-tissue]
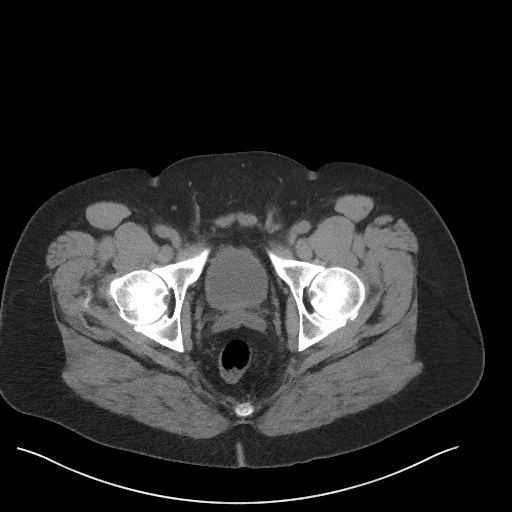
[im 24/84  soft-tissue]
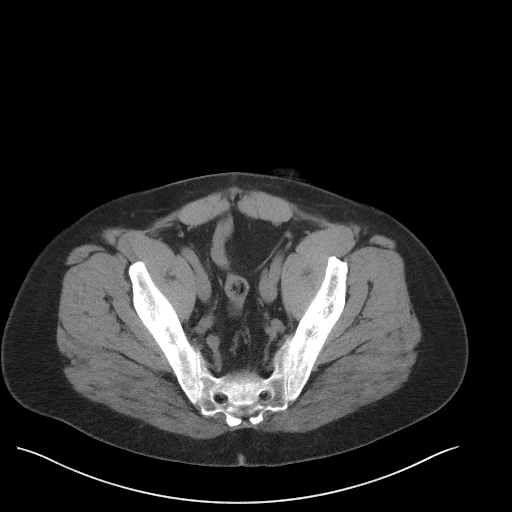
[im 28/84  soft-tissue]
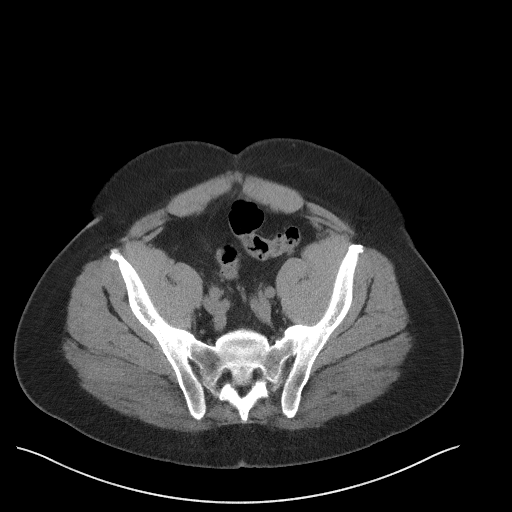
[im 36/84  soft-tissue]
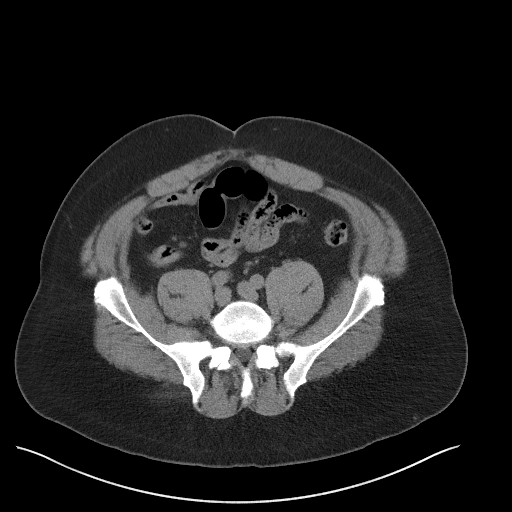
[im 44/84  soft-tissue]
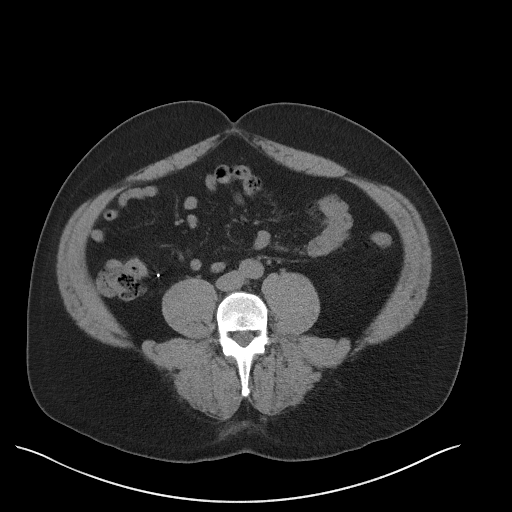
[im 48/84  soft-tissue]
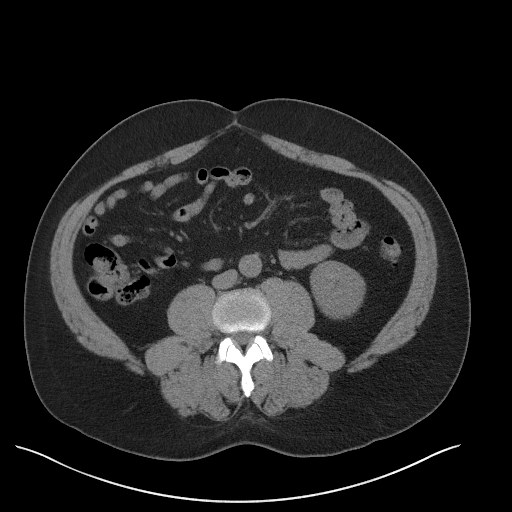
[im 56/84  soft-tissue]
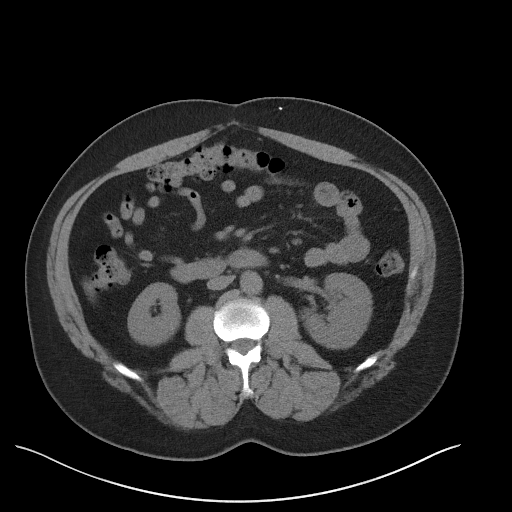
[im 56/84  bone]
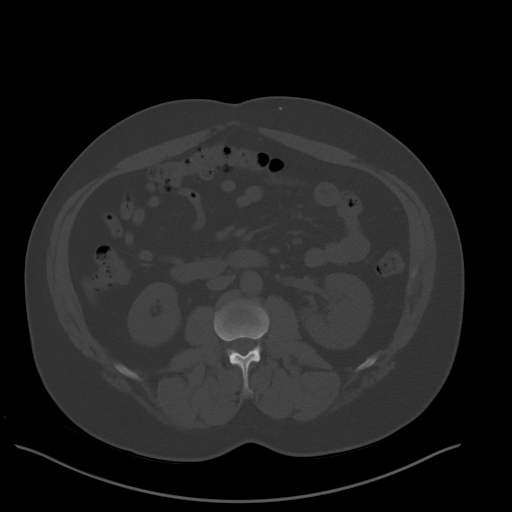
[im 60/84  soft-tissue]
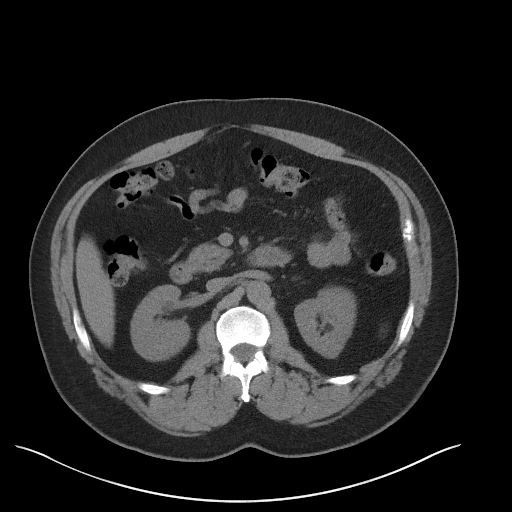
[im 68/84  soft-tissue]
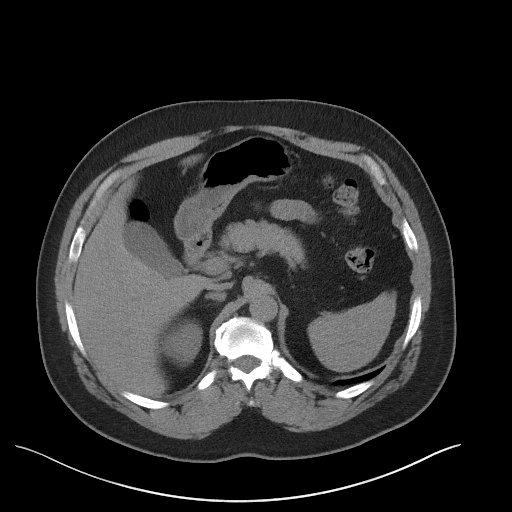
[im 72/84  soft-tissue]
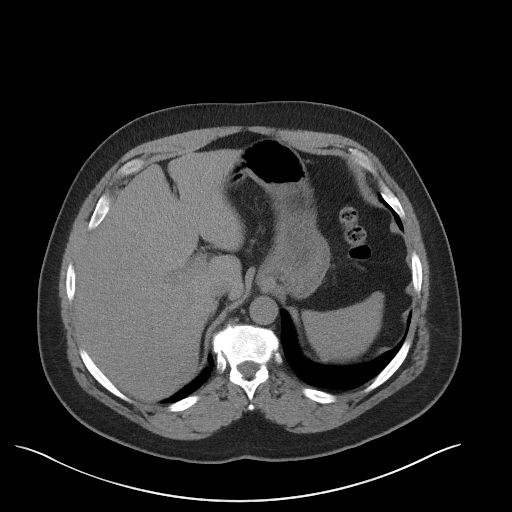
[im 80/84  soft-tissue]
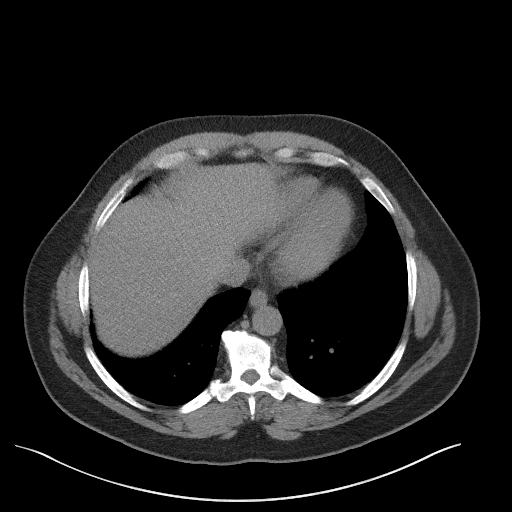

[Series 5: coronal · coronal · 0.77mm/px · 3 of 161 slices shown]
[im 54/161  soft-tissue]
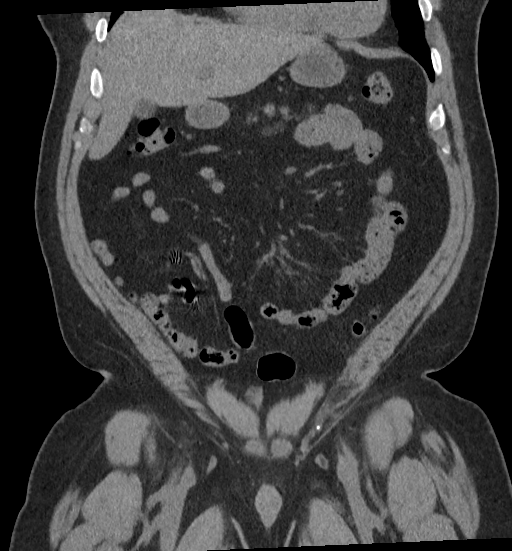
[im 72/161  soft-tissue]
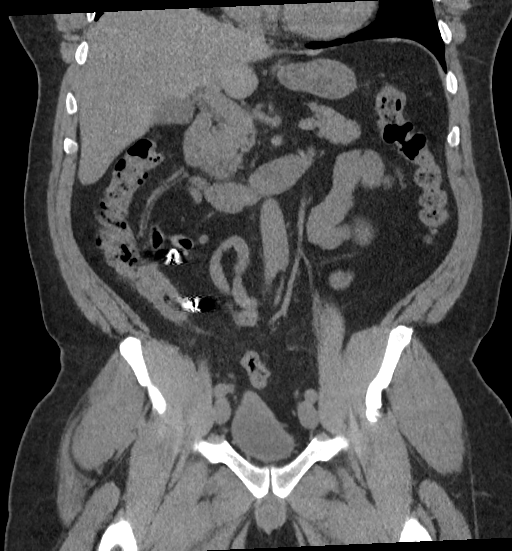
[im 89/161  soft-tissue]
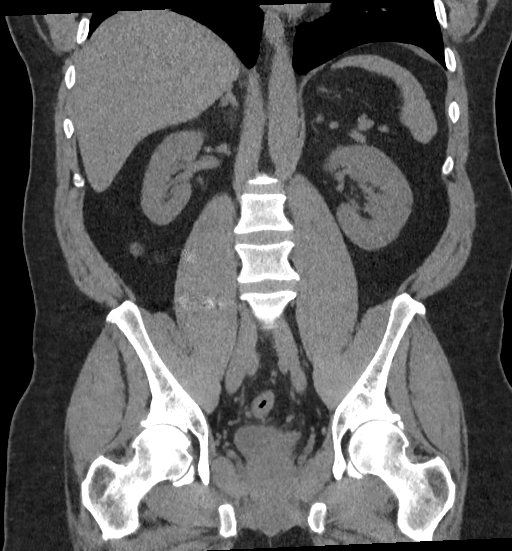

[16 of 46 positions shown; findings below may reference images not displayed]

FINDINGS: The lack of intravenous contrast limits the ability to evaluate
solid abdominal organs.

Lower chest: Limited visualization of the lower thorax demonstrates
minimal subsegmental atelectasis involving the posteromedial aspect
of the right lower lobe. No focal airspace opacities. No pleural
effusion.

Normal heart size.  No pericardial effusion.

Hepatobiliary: Normal hepatic contour. Normal noncontrast appearance
of the gallbladder given degree distention. No radiopaque
gallstones. No ascites.

Pancreas: Normal noncontrast appearance of the pancreas.

Spleen: Normal noncontrast appearance of the spleen. Note is made of
a small splenule.

Adrenals/Urinary Tract: Normal noncontrast appearance of the
bilateral kidneys. No renal stones. No renal stones are seen along
the expected course of either ureter or the urinary bladder. Normal
noncontrast appearance of the urinary bladder given degree of
distention. No urinary obstruction or perinephric stranding.

Normal noncontrast appearance the bilateral adrenal glands.

Stomach/Bowel: Small hiatal hernia. Moderate colonic stool burden
without evidence of enteric obstruction. Normal noncontrast
appearance of the terminal ileum. The appendix is not visualized
compatible with provided operative history. No pneumoperitoneum,
pneumatosis or portal venous gas.

Vascular/Lymphatic: Normal caliber of the abdominal aorta. No bulky
retroperitoneal, mesenteric, pelvic or inguinal lymphadenopathy.

Reproductive: Normal noncontrast appearance of the prostate gland.
No free fluid the pelvic cul-de-sac.

Other: Tiny mesenteric fat containing periumbilical hernia. Punctate
radiopaque foreign body is imbedded within the subcutaneous tissues
of the mid ventral abdominal wall (image 30, series 2), without
associated subcutaneous emphysema.

Musculoskeletal: No acute or aggressive osseous abnormalities.
Stigmata of DISH within the lower thoracic spine.
IMPRESSION: 1. No explanation for patient's hematuria and dysuria. Specifically,
no evidence of nephrolithiasis or urinary obstruction.
2. Post appendectomy.
3. Small hiatal hernia.

## 2021-05-03 ENCOUNTER — Other Ambulatory Visit: Payer: Self-pay

## 2021-05-23 ENCOUNTER — Other Ambulatory Visit: Payer: Self-pay

## 2021-06-06 ENCOUNTER — Other Ambulatory Visit: Payer: Self-pay

## 2021-06-13 ENCOUNTER — Other Ambulatory Visit: Payer: Self-pay

## 2021-06-13 DIAGNOSIS — D649 Anemia, unspecified: Secondary | ICD-10-CM | POA: Diagnosis not present

## 2021-06-13 DIAGNOSIS — Z1331 Encounter for screening for depression: Secondary | ICD-10-CM | POA: Diagnosis not present

## 2021-06-13 DIAGNOSIS — Z0000 Encounter for general adult medical examination without abnormal findings: Secondary | ICD-10-CM | POA: Diagnosis not present

## 2021-06-13 DIAGNOSIS — K50011 Crohn's disease of small intestine with rectal bleeding: Secondary | ICD-10-CM | POA: Diagnosis not present

## 2021-06-13 DIAGNOSIS — I10 Essential (primary) hypertension: Secondary | ICD-10-CM | POA: Diagnosis not present

## 2021-06-13 DIAGNOSIS — R7309 Other abnormal glucose: Secondary | ICD-10-CM | POA: Diagnosis not present

## 2021-06-13 MED ORDER — LOSARTAN POTASSIUM 100 MG PO TABS
ORAL_TABLET | ORAL | 1 refills | Status: DC
  Filled 2021-06-13 – 2021-08-02 (×2): qty 90, 90d supply, fill #0
  Filled 2021-11-02: qty 90, 90d supply, fill #1

## 2021-06-24 ENCOUNTER — Other Ambulatory Visit: Payer: Self-pay

## 2021-07-08 ENCOUNTER — Other Ambulatory Visit: Payer: Self-pay

## 2021-08-02 ENCOUNTER — Other Ambulatory Visit: Payer: Self-pay

## 2021-08-15 ENCOUNTER — Other Ambulatory Visit: Payer: Self-pay

## 2021-08-15 MED ORDER — AMLODIPINE BESYLATE 10 MG PO TABS
10.0000 mg | ORAL_TABLET | Freq: Every day | ORAL | 1 refills | Status: DC
  Filled 2021-08-15: qty 90, 90d supply, fill #0
  Filled 2021-11-14: qty 90, 90d supply, fill #1

## 2021-09-19 ENCOUNTER — Other Ambulatory Visit: Payer: Self-pay

## 2021-09-20 ENCOUNTER — Other Ambulatory Visit: Payer: Self-pay

## 2021-09-21 ENCOUNTER — Other Ambulatory Visit: Payer: Self-pay

## 2021-09-21 MED ORDER — POTASSIUM CHLORIDE CRYS ER 20 MEQ PO TBCR
20.0000 meq | EXTENDED_RELEASE_TABLET | Freq: Two times a day (BID) | ORAL | 3 refills | Status: DC
  Filled 2021-09-21: qty 90, 45d supply, fill #0
  Filled 2021-11-14: qty 90, 45d supply, fill #1
  Filled 2022-01-31: qty 90, 45d supply, fill #2
  Filled 2022-05-01: qty 90, 45d supply, fill #3

## 2021-10-18 DIAGNOSIS — L0889 Other specified local infections of the skin and subcutaneous tissue: Secondary | ICD-10-CM | POA: Diagnosis not present

## 2021-10-18 DIAGNOSIS — A630 Anogenital (venereal) warts: Secondary | ICD-10-CM | POA: Diagnosis not present

## 2021-10-18 DIAGNOSIS — R238 Other skin changes: Secondary | ICD-10-CM | POA: Diagnosis not present

## 2021-10-19 DIAGNOSIS — R238 Other skin changes: Secondary | ICD-10-CM | POA: Diagnosis not present

## 2021-10-19 DIAGNOSIS — A630 Anogenital (venereal) warts: Secondary | ICD-10-CM | POA: Diagnosis not present

## 2021-11-02 ENCOUNTER — Other Ambulatory Visit: Payer: Self-pay

## 2021-11-14 ENCOUNTER — Other Ambulatory Visit: Payer: Self-pay

## 2021-12-12 ENCOUNTER — Other Ambulatory Visit: Payer: Self-pay

## 2021-12-12 DIAGNOSIS — J301 Allergic rhinitis due to pollen: Secondary | ICD-10-CM | POA: Diagnosis not present

## 2021-12-12 DIAGNOSIS — D649 Anemia, unspecified: Secondary | ICD-10-CM | POA: Diagnosis not present

## 2021-12-12 DIAGNOSIS — L739 Follicular disorder, unspecified: Secondary | ICD-10-CM | POA: Diagnosis not present

## 2021-12-12 DIAGNOSIS — I10 Essential (primary) hypertension: Secondary | ICD-10-CM | POA: Diagnosis not present

## 2021-12-12 DIAGNOSIS — Z6836 Body mass index (BMI) 36.0-36.9, adult: Secondary | ICD-10-CM | POA: Diagnosis not present

## 2021-12-12 DIAGNOSIS — K50118 Crohn's disease of large intestine with other complication: Secondary | ICD-10-CM | POA: Diagnosis not present

## 2021-12-12 DIAGNOSIS — Z125 Encounter for screening for malignant neoplasm of prostate: Secondary | ICD-10-CM | POA: Diagnosis not present

## 2021-12-12 DIAGNOSIS — K219 Gastro-esophageal reflux disease without esophagitis: Secondary | ICD-10-CM | POA: Diagnosis not present

## 2021-12-12 DIAGNOSIS — R7309 Other abnormal glucose: Secondary | ICD-10-CM | POA: Diagnosis not present

## 2021-12-12 MED ORDER — DOXYCYCLINE HYCLATE 100 MG PO CAPS
ORAL_CAPSULE | ORAL | 0 refills | Status: DC
  Filled 2021-12-12: qty 20, 10d supply, fill #0

## 2022-01-31 ENCOUNTER — Other Ambulatory Visit: Payer: Self-pay

## 2022-01-31 MED ORDER — LOSARTAN POTASSIUM 100 MG PO TABS
ORAL_TABLET | ORAL | 1 refills | Status: DC
  Filled 2022-01-31: qty 90, 90d supply, fill #0
  Filled 2022-05-01: qty 90, 90d supply, fill #1

## 2022-02-07 ENCOUNTER — Other Ambulatory Visit: Payer: Self-pay

## 2022-02-08 ENCOUNTER — Other Ambulatory Visit: Payer: Self-pay

## 2022-02-08 MED ORDER — AMLODIPINE BESYLATE 10 MG PO TABS
10.0000 mg | ORAL_TABLET | Freq: Every day | ORAL | 1 refills | Status: DC
  Filled 2022-02-08: qty 90, 90d supply, fill #0
  Filled 2022-05-01: qty 90, 90d supply, fill #1

## 2022-03-30 DIAGNOSIS — E538 Deficiency of other specified B group vitamins: Secondary | ICD-10-CM | POA: Diagnosis not present

## 2022-03-30 DIAGNOSIS — K50012 Crohn's disease of small intestine with intestinal obstruction: Secondary | ICD-10-CM | POA: Diagnosis not present

## 2022-03-30 DIAGNOSIS — D500 Iron deficiency anemia secondary to blood loss (chronic): Secondary | ICD-10-CM | POA: Diagnosis not present

## 2022-04-15 DIAGNOSIS — M5442 Lumbago with sciatica, left side: Secondary | ICD-10-CM | POA: Diagnosis not present

## 2022-04-15 DIAGNOSIS — M5441 Lumbago with sciatica, right side: Secondary | ICD-10-CM | POA: Diagnosis not present

## 2022-05-01 ENCOUNTER — Other Ambulatory Visit: Payer: Self-pay

## 2022-05-02 ENCOUNTER — Other Ambulatory Visit: Payer: Self-pay

## 2022-05-19 DIAGNOSIS — K50818 Crohn's disease of both small and large intestine with other complication: Secondary | ICD-10-CM | POA: Diagnosis not present

## 2022-05-19 DIAGNOSIS — G4733 Obstructive sleep apnea (adult) (pediatric): Secondary | ICD-10-CM | POA: Diagnosis not present

## 2022-05-19 DIAGNOSIS — K5080 Crohn's disease of both small and large intestine without complications: Secondary | ICD-10-CM | POA: Diagnosis not present

## 2022-05-19 DIAGNOSIS — K56609 Unspecified intestinal obstruction, unspecified as to partial versus complete obstruction: Secondary | ICD-10-CM | POA: Diagnosis not present

## 2022-05-19 DIAGNOSIS — K633 Ulcer of intestine: Secondary | ICD-10-CM | POA: Diagnosis not present

## 2022-05-19 DIAGNOSIS — Z980 Intestinal bypass and anastomosis status: Secondary | ICD-10-CM | POA: Diagnosis not present

## 2022-05-19 DIAGNOSIS — K219 Gastro-esophageal reflux disease without esophagitis: Secondary | ICD-10-CM | POA: Diagnosis not present

## 2022-05-19 DIAGNOSIS — I10 Essential (primary) hypertension: Secondary | ICD-10-CM | POA: Diagnosis not present

## 2022-05-19 DIAGNOSIS — Z79899 Other long term (current) drug therapy: Secondary | ICD-10-CM | POA: Diagnosis not present

## 2022-05-23 DIAGNOSIS — K219 Gastro-esophageal reflux disease without esophagitis: Secondary | ICD-10-CM | POA: Diagnosis not present

## 2022-05-23 DIAGNOSIS — K50012 Crohn's disease of small intestine with intestinal obstruction: Secondary | ICD-10-CM | POA: Diagnosis not present

## 2022-05-23 DIAGNOSIS — M25522 Pain in left elbow: Secondary | ICD-10-CM | POA: Diagnosis not present

## 2022-05-23 DIAGNOSIS — Z23 Encounter for immunization: Secondary | ICD-10-CM | POA: Diagnosis not present

## 2022-05-23 DIAGNOSIS — G4730 Sleep apnea, unspecified: Secondary | ICD-10-CM | POA: Diagnosis not present

## 2022-05-23 DIAGNOSIS — I10 Essential (primary) hypertension: Secondary | ICD-10-CM | POA: Diagnosis not present

## 2022-05-23 DIAGNOSIS — J302 Other seasonal allergic rhinitis: Secondary | ICD-10-CM | POA: Diagnosis not present

## 2022-05-23 DIAGNOSIS — M25561 Pain in right knee: Secondary | ICD-10-CM | POA: Diagnosis not present

## 2022-05-23 DIAGNOSIS — Z79899 Other long term (current) drug therapy: Secondary | ICD-10-CM | POA: Diagnosis not present

## 2022-05-23 DIAGNOSIS — D500 Iron deficiency anemia secondary to blood loss (chronic): Secondary | ICD-10-CM | POA: Diagnosis not present

## 2022-05-23 DIAGNOSIS — M25521 Pain in right elbow: Secondary | ICD-10-CM | POA: Diagnosis not present

## 2022-05-23 DIAGNOSIS — E538 Deficiency of other specified B group vitamins: Secondary | ICD-10-CM | POA: Diagnosis not present

## 2022-06-07 ENCOUNTER — Other Ambulatory Visit (HOSPITAL_COMMUNITY): Payer: Self-pay

## 2022-06-08 ENCOUNTER — Other Ambulatory Visit (HOSPITAL_COMMUNITY): Payer: Self-pay

## 2022-06-08 MED ORDER — RINVOQ 45 MG PO TB24
45.0000 mg | ORAL_TABLET | Freq: Every day | ORAL | 0 refills | Status: DC
  Filled 2022-06-08: qty 28, 28d supply, fill #0

## 2022-06-08 MED ORDER — RINVOQ 30 MG PO TB24
30.0000 mg | ORAL_TABLET | Freq: Every day | ORAL | 0 refills | Status: DC
  Filled 2022-06-08: qty 30, 30d supply, fill #0

## 2022-06-09 ENCOUNTER — Other Ambulatory Visit (HOSPITAL_COMMUNITY): Payer: Self-pay

## 2022-06-09 ENCOUNTER — Ambulatory Visit: Payer: BC Managed Care – PPO | Attending: Internal Medicine | Admitting: Pharmacist

## 2022-06-09 ENCOUNTER — Other Ambulatory Visit: Payer: Self-pay

## 2022-06-09 DIAGNOSIS — Z7189 Other specified counseling: Secondary | ICD-10-CM

## 2022-06-09 MED ORDER — RINVOQ 45 MG PO TB24
45.0000 mg | ORAL_TABLET | Freq: Every day | ORAL | 0 refills | Status: DC
  Filled 2022-06-09: qty 28, 28d supply, fill #0
  Filled 2022-07-07: qty 28, 28d supply, fill #1
  Filled 2022-07-27: qty 28, 28d supply, fill #2

## 2022-06-09 MED ORDER — RINVOQ 30 MG PO TB24
30.0000 mg | ORAL_TABLET | Freq: Every day | ORAL | 0 refills | Status: DC
  Filled 2022-06-09: qty 90, 90d supply, fill #0
  Filled 2022-08-29: qty 30, 30d supply, fill #0
  Filled 2022-09-22: qty 30, 30d supply, fill #1
  Filled 2022-10-27: qty 30, 30d supply, fill #2

## 2022-06-09 NOTE — Progress Notes (Signed)
  S: Patient presents today for review of their specialty medication.   Patient is about to start taking Rinvoq for Crohn's disease. Patient is managed by Dr. Alisia Ferrari for this. Has tried and has achieved remission with Remicade, however, he's now experiencing recurrence of disease.   Dosing: Adult  Crohn's disease: induction 45 mg once daily x12 weeks followed by 15 mg once daily (PO) for most patients. In some patients, may consider increased mt dose of 30 mg once daily.   Adherence: has not started   Efficacy: has not started   Monitoring: S/sx thromboembolism: none, no medical hx S/sx malignancy: none S/sx of infection:none   Current adverse effects: none    O:     Lab Results  Component Value Date   WBC 9.3 09/03/2019   HGB 12.7 (L) 09/03/2019   HCT 37.8 (L) 09/03/2019   MCV 81.5 09/03/2019   PLT 342 09/03/2019      Chemistry      Component Value Date/Time   NA 137 09/03/2019 1918   NA 142 10/15/2013 1822   K 2.6 (LL) 09/03/2019 1918   K 3.0 (L) 10/15/2013 1822   CL 96 (L) 09/03/2019 1918   CL 105 10/15/2013 1822   CO2 34 (H) 09/03/2019 1918   CO2 32 10/15/2013 1822   BUN 17 09/03/2019 1918   BUN 14 10/15/2013 1822   CREATININE 1.06 09/03/2019 1918   CREATININE 1.17 10/15/2013 1822      Component Value Date/Time   CALCIUM 8.9 09/03/2019 1918   CALCIUM 9.0 10/15/2013 1822   ALKPHOS 124 09/03/2019 1918   ALKPHOS 109 10/15/2013 1822   AST 18 09/03/2019 1918   AST 14 (L) 10/15/2013 1822   ALT 23 09/03/2019 1918   ALT 21 10/15/2013 1822   BILITOT 0.9 09/03/2019 1918   BILITOT 0.6 10/15/2013 1822       No results found for: "CHOL", "HDL", "LDLCALC", "LDLDIRECT", "TRIG", "CHOLHDL"   A/P: 1. Medication review: patient currently prescribed Rinvoq for rheumatoid arthritis. Reviewed the medication with the patient, including the following: Rinvoq is a medication used to treat rheumatoid arthritis. Administer with or without food. Swallow tablet whole; do  not crush, split, or chew. Possible adverse effects include increased risk of infection, GI upset, hematologic toxicity, hepatic effects, lipid abnormalities, increased risk of malignancy, thromboembolism. Avoid live vaccinations. No recommendations for any changes.  Benard Halsted, PharmD, [facility] [place], [place] [phone]

## 2022-06-12 ENCOUNTER — Other Ambulatory Visit (HOSPITAL_COMMUNITY): Payer: Self-pay

## 2022-06-14 DIAGNOSIS — R7309 Other abnormal glucose: Secondary | ICD-10-CM | POA: Diagnosis not present

## 2022-06-14 DIAGNOSIS — D649 Anemia, unspecified: Secondary | ICD-10-CM | POA: Diagnosis not present

## 2022-06-14 DIAGNOSIS — M130 Polyarthritis, unspecified: Secondary | ICD-10-CM | POA: Diagnosis not present

## 2022-06-14 DIAGNOSIS — R972 Elevated prostate specific antigen [PSA]: Secondary | ICD-10-CM | POA: Diagnosis not present

## 2022-06-14 DIAGNOSIS — Z6837 Body mass index (BMI) 37.0-37.9, adult: Secondary | ICD-10-CM | POA: Diagnosis not present

## 2022-06-14 DIAGNOSIS — K50918 Crohn's disease, unspecified, with other complication: Secondary | ICD-10-CM | POA: Diagnosis not present

## 2022-06-14 DIAGNOSIS — Z0000 Encounter for general adult medical examination without abnormal findings: Secondary | ICD-10-CM | POA: Diagnosis not present

## 2022-06-14 DIAGNOSIS — K219 Gastro-esophageal reflux disease without esophagitis: Secondary | ICD-10-CM | POA: Diagnosis not present

## 2022-06-14 DIAGNOSIS — Z1331 Encounter for screening for depression: Secondary | ICD-10-CM | POA: Diagnosis not present

## 2022-06-14 DIAGNOSIS — I10 Essential (primary) hypertension: Secondary | ICD-10-CM | POA: Diagnosis not present

## 2022-06-15 ENCOUNTER — Other Ambulatory Visit (HOSPITAL_COMMUNITY): Payer: Self-pay

## 2022-06-19 ENCOUNTER — Other Ambulatory Visit (HOSPITAL_COMMUNITY): Payer: Self-pay

## 2022-07-02 ENCOUNTER — Other Ambulatory Visit: Payer: Self-pay

## 2022-07-04 ENCOUNTER — Other Ambulatory Visit (HOSPITAL_COMMUNITY): Payer: Self-pay

## 2022-07-04 ENCOUNTER — Other Ambulatory Visit: Payer: Self-pay

## 2022-07-04 MED ORDER — POTASSIUM CHLORIDE CRYS ER 20 MEQ PO TBCR
20.0000 meq | EXTENDED_RELEASE_TABLET | Freq: Two times a day (BID) | ORAL | 3 refills | Status: DC
  Filled 2022-07-04: qty 90, 45d supply, fill #0
  Filled 2022-08-29: qty 90, 45d supply, fill #1
  Filled 2022-10-24: qty 90, 45d supply, fill #2
  Filled 2022-12-18: qty 90, 45d supply, fill #3

## 2022-07-06 ENCOUNTER — Other Ambulatory Visit (HOSPITAL_COMMUNITY): Payer: Self-pay

## 2022-07-07 ENCOUNTER — Other Ambulatory Visit (HOSPITAL_COMMUNITY): Payer: Self-pay

## 2022-07-11 DIAGNOSIS — K50012 Crohn's disease of small intestine with intestinal obstruction: Secondary | ICD-10-CM | POA: Diagnosis not present

## 2022-07-27 ENCOUNTER — Other Ambulatory Visit (HOSPITAL_COMMUNITY): Payer: Self-pay

## 2022-07-27 ENCOUNTER — Other Ambulatory Visit: Payer: Self-pay

## 2022-07-27 MED ORDER — LOSARTAN POTASSIUM 100 MG PO TABS
100.0000 mg | ORAL_TABLET | Freq: Every day | ORAL | 1 refills | Status: DC
  Filled 2022-07-27: qty 90, 90d supply, fill #0
  Filled 2022-10-24: qty 90, 90d supply, fill #1

## 2022-08-02 ENCOUNTER — Other Ambulatory Visit (HOSPITAL_COMMUNITY): Payer: Self-pay

## 2022-08-12 ENCOUNTER — Other Ambulatory Visit: Payer: Self-pay

## 2022-08-14 ENCOUNTER — Other Ambulatory Visit: Payer: Self-pay

## 2022-08-14 MED ORDER — AMLODIPINE BESYLATE 10 MG PO TABS
10.0000 mg | ORAL_TABLET | Freq: Every day | ORAL | 1 refills | Status: DC
  Filled 2022-08-14: qty 90, 90d supply, fill #0
  Filled 2022-11-13: qty 90, 90d supply, fill #1

## 2022-08-17 DIAGNOSIS — Z79899 Other long term (current) drug therapy: Secondary | ICD-10-CM | POA: Diagnosis not present

## 2022-08-17 DIAGNOSIS — I10 Essential (primary) hypertension: Secondary | ICD-10-CM | POA: Diagnosis not present

## 2022-08-17 DIAGNOSIS — Z23 Encounter for immunization: Secondary | ICD-10-CM | POA: Diagnosis not present

## 2022-08-17 DIAGNOSIS — K50012 Crohn's disease of small intestine with intestinal obstruction: Secondary | ICD-10-CM | POA: Diagnosis not present

## 2022-08-29 ENCOUNTER — Other Ambulatory Visit: Payer: Self-pay

## 2022-08-29 ENCOUNTER — Other Ambulatory Visit (HOSPITAL_COMMUNITY): Payer: Self-pay

## 2022-08-31 ENCOUNTER — Other Ambulatory Visit (HOSPITAL_COMMUNITY): Payer: Self-pay

## 2022-08-31 ENCOUNTER — Other Ambulatory Visit: Payer: Self-pay

## 2022-09-21 ENCOUNTER — Other Ambulatory Visit (HOSPITAL_COMMUNITY): Payer: Self-pay

## 2022-09-22 ENCOUNTER — Other Ambulatory Visit (HOSPITAL_COMMUNITY): Payer: Self-pay

## 2022-09-22 ENCOUNTER — Other Ambulatory Visit: Payer: Self-pay

## 2022-09-26 ENCOUNTER — Other Ambulatory Visit (HOSPITAL_COMMUNITY): Payer: Self-pay

## 2022-10-24 ENCOUNTER — Other Ambulatory Visit (HOSPITAL_COMMUNITY): Payer: Self-pay

## 2022-10-27 ENCOUNTER — Other Ambulatory Visit (HOSPITAL_COMMUNITY): Payer: Self-pay

## 2022-10-30 ENCOUNTER — Other Ambulatory Visit: Payer: Self-pay

## 2022-11-01 ENCOUNTER — Other Ambulatory Visit (HOSPITAL_COMMUNITY): Payer: Self-pay

## 2022-11-01 ENCOUNTER — Other Ambulatory Visit: Payer: Self-pay

## 2022-11-02 ENCOUNTER — Other Ambulatory Visit (HOSPITAL_COMMUNITY): Payer: Self-pay

## 2022-11-02 ENCOUNTER — Other Ambulatory Visit: Payer: Self-pay

## 2022-11-27 ENCOUNTER — Other Ambulatory Visit: Payer: Self-pay

## 2022-11-29 ENCOUNTER — Other Ambulatory Visit: Payer: Self-pay | Admitting: Pharmacist

## 2022-11-29 ENCOUNTER — Other Ambulatory Visit (HOSPITAL_COMMUNITY): Payer: Self-pay

## 2022-11-29 ENCOUNTER — Other Ambulatory Visit: Payer: Self-pay

## 2022-11-29 MED ORDER — RINVOQ 30 MG PO TB24: ORAL_TABLET | ORAL | 0 refills | Status: DC

## 2022-11-29 MED ORDER — RINVOQ 30 MG PO TB24
ORAL_TABLET | ORAL | 0 refills | Status: DC
  Filled 2022-11-29: qty 30, 30d supply, fill #0

## 2022-12-04 ENCOUNTER — Other Ambulatory Visit (HOSPITAL_COMMUNITY): Payer: Self-pay

## 2022-12-13 ENCOUNTER — Other Ambulatory Visit: Payer: Self-pay

## 2022-12-13 DIAGNOSIS — D649 Anemia, unspecified: Secondary | ICD-10-CM | POA: Diagnosis not present

## 2022-12-13 DIAGNOSIS — R7309 Other abnormal glucose: Secondary | ICD-10-CM | POA: Diagnosis not present

## 2022-12-13 DIAGNOSIS — R682 Dry mouth, unspecified: Secondary | ICD-10-CM | POA: Diagnosis not present

## 2022-12-13 DIAGNOSIS — Z6838 Body mass index (BMI) 38.0-38.9, adult: Secondary | ICD-10-CM | POA: Diagnosis not present

## 2022-12-13 DIAGNOSIS — R7989 Other specified abnormal findings of blood chemistry: Secondary | ICD-10-CM | POA: Diagnosis not present

## 2022-12-13 DIAGNOSIS — H60593 Other noninfective acute otitis externa, bilateral: Secondary | ICD-10-CM | POA: Diagnosis not present

## 2022-12-13 DIAGNOSIS — Z125 Encounter for screening for malignant neoplasm of prostate: Secondary | ICD-10-CM | POA: Diagnosis not present

## 2022-12-13 DIAGNOSIS — G4733 Obstructive sleep apnea (adult) (pediatric): Secondary | ICD-10-CM | POA: Diagnosis not present

## 2022-12-13 DIAGNOSIS — K50919 Crohn's disease, unspecified, with unspecified complications: Secondary | ICD-10-CM | POA: Diagnosis not present

## 2022-12-13 DIAGNOSIS — I10 Essential (primary) hypertension: Secondary | ICD-10-CM | POA: Diagnosis not present

## 2022-12-13 MED ORDER — FLUTICASONE PROPIONATE 50 MCG/ACT NA SUSP
2.0000 | Freq: Every day | NASAL | 5 refills | Status: AC
  Filled 2022-12-13: qty 16, 30d supply, fill #0
  Filled 2023-04-03: qty 16, 30d supply, fill #1

## 2022-12-13 MED ORDER — NEOMYCIN-POLYMYXIN-HC 1 % OT SOLN
3.0000 [drp] | Freq: Three times a day (TID) | OTIC | 0 refills | Status: AC
  Filled 2022-12-13: qty 10, 10d supply, fill #0

## 2022-12-18 ENCOUNTER — Other Ambulatory Visit: Payer: Self-pay

## 2023-01-02 ENCOUNTER — Other Ambulatory Visit (HOSPITAL_COMMUNITY): Payer: Self-pay

## 2023-01-03 ENCOUNTER — Other Ambulatory Visit: Payer: Self-pay | Admitting: Pharmacist

## 2023-01-03 ENCOUNTER — Other Ambulatory Visit (HOSPITAL_COMMUNITY): Payer: Self-pay

## 2023-01-03 ENCOUNTER — Other Ambulatory Visit: Payer: Self-pay

## 2023-01-03 DIAGNOSIS — K50012 Crohn's disease of small intestine with intestinal obstruction: Secondary | ICD-10-CM | POA: Diagnosis not present

## 2023-01-03 MED ORDER — RINVOQ 30 MG PO TB24
1.0000 | ORAL_TABLET | Freq: Every day | ORAL | 0 refills | Status: DC
  Filled 2023-01-03: qty 30, 30d supply, fill #0

## 2023-01-03 MED ORDER — RINVOQ 30 MG PO TB24: 1.0000 | ORAL_TABLET | Freq: Every day | ORAL | 0 refills | Status: DC

## 2023-01-05 ENCOUNTER — Other Ambulatory Visit: Payer: Self-pay

## 2023-01-05 ENCOUNTER — Other Ambulatory Visit: Payer: Self-pay | Admitting: Pharmacist

## 2023-01-05 ENCOUNTER — Other Ambulatory Visit (HOSPITAL_COMMUNITY): Payer: Self-pay

## 2023-01-05 MED ORDER — RINVOQ 30 MG PO TB24: 1.0000 | ORAL_TABLET | Freq: Every day | ORAL | 2 refills | Status: DC

## 2023-01-05 MED ORDER — RINVOQ 30 MG PO TB24
1.0000 | ORAL_TABLET | Freq: Every day | ORAL | 2 refills | Status: DC
  Filled 2023-01-05 – 2023-01-23 (×2): qty 30, 30d supply, fill #0
  Filled 2023-02-22: qty 30, 30d supply, fill #1
  Filled 2023-04-02: qty 30, 30d supply, fill #2

## 2023-01-21 ENCOUNTER — Encounter: Payer: Self-pay | Admitting: Emergency Medicine

## 2023-01-21 ENCOUNTER — Ambulatory Visit
Admission: EM | Admit: 2023-01-21 | Discharge: 2023-01-21 | Disposition: A | Discharge status: Home / Self Care | Payer: 59 | Attending: Family Medicine | Admitting: Family Medicine

## 2023-01-21 DIAGNOSIS — S39012A Strain of muscle, fascia and tendon of lower back, initial encounter: Secondary | ICD-10-CM | POA: Diagnosis not present

## 2023-01-21 DIAGNOSIS — I10 Essential (primary) hypertension: Secondary | ICD-10-CM

## 2023-01-21 LAB — URINALYSIS, W/ REFLEX TO CULTURE (INFECTION SUSPECTED)
Bacteria, UA: NONE SEEN
Bilirubin Urine: NEGATIVE
Glucose, UA: NEGATIVE mg/dL
Hgb urine dipstick: NEGATIVE
Ketones, ur: NEGATIVE mg/dL
Leukocytes,Ua: NEGATIVE
Nitrite: NEGATIVE
Protein, ur: NEGATIVE mg/dL
RBC / HPF: NONE SEEN RBC/hpf (ref 0–5)
Specific Gravity, Urine: 1.025 (ref 1.005–1.030)
Squamous Epithelial / HPF: NONE SEEN /HPF (ref 0–5)
WBC, UA: NONE SEEN WBC/hpf (ref 0–5)
pH: 6 (ref 5.0–8.0)

## 2023-01-21 MED ORDER — KETOROLAC TROMETHAMINE 60 MG/2ML IM SOLN
30.0000 mg | Freq: Once | INTRAMUSCULAR | Status: AC
  Administered 2023-01-21: 30 mg via INTRAMUSCULAR

## 2023-01-21 MED ORDER — NAPROXEN 500 MG PO TABS: 500.0000 mg | ORAL_TABLET | Freq: Two times a day (BID) | ORAL | 0 refills | Status: DC

## 2023-01-21 MED ORDER — METHOCARBAMOL 500 MG PO TABS: 500.0000 mg | ORAL_TABLET | Freq: Two times a day (BID) | ORAL | 0 refills | Status: DC

## 2023-01-21 NOTE — Discharge Instructions (Signed)
If medication was prescribed, stop by the pharmacy to pick up your prescriptions.  For your back pain, Take 1500 mg Tylenol twice a day, take muscle relaxer (methocarbamol/Robaxin) twice a day, take Naprosyn twice a day,  as needed for pain.  Robaxin may make you sleepy.  Consider stopping by the pharmacy or dollar store to pick up some Lidocaine patches. Apply for 12 hours and then remove.   Watch for worsening symptoms such as an increasing weakness or loss of sensation in your arms or legs, increasing pain and/or the loss of bladder or bowel function. Should any of these occur, go to the emergency department immediately.

## 2023-01-21 NOTE — ED Provider Notes (Addendum)
[facility]    CSN: [id number] Arrival date & time: 01/21/23  1106      History   Chief Complaint Chief Complaint  Patient presents with   Back Pain    HPI  HPI  Spencer Heath is a 57 y.o. male lower back pain that is worse with walking that started 3-4 days. Pain present when he wakes up in the morning as soon as starts moving. Leaning forwad can make it feel better. Took some Tylenol, ibuprofen and 1- day of muscle relaxers that didn't help like they normal do. Previous got a shots in his back but they pain improved over the years.  He has urinary frequency at night but drinks a lot of water. When he stops drinking at a certain time this improves his nocturia.  No hematuria or dysuria.     Past Medical History:  Diagnosis Date   Arthritis    Crohn's disease (HCC)    GERD (gastroesophageal reflux disease)    Hypertension     Patient Active Problem List   Diagnosis Date Noted   Small bowel obstruction (HCC) 01/24/2015   Crohn's disease (HCC) 01/24/2015    Past Surgical History:  Procedure Laterality Date   ABDOMINAL SURGERY     APPENDECTOMY     DORSAL COMPARTMENT RELEASE Right 05/14/2019   Procedure: RELEASE DORSAL COMPARTMENT (DEQUERVAIN);  Surgeon: Christena Flake, MD;  Location: [facility] ORS;  Service: Orthopedics;  Laterality: Right;   testical removed     TESTICLE SURGERY         Home Medications    Prior to Admission medications   Medication Sig Start Date End Date Taking? Authorizing Provider  losartan (COZAAR) 100 MG tablet Take 1 tablet (100 mg total) by mouth daily. 07/27/22  Yes   methocarbamol (ROBAXIN) 500 MG tablet Take 1 tablet (500 mg total) by mouth 2 (two) times daily. 01/21/23  Yes Tammatha Cobb, DO  naproxen (NAPROSYN) 500 MG tablet Take 1 tablet (500 mg total) by mouth 2 (two) times daily with a meal. 01/21/23  Yes Sonny Anthes, DO  potassium chloride SA (KLOR-CON M) 20 MEQ tablet Take 1 tablet (20 mEq total) by mouth 2 (two)  times daily 07/04/22  Yes   amLODipine (NORVASC) 10 MG tablet Take 1 tablet (10 mg total) by mouth daily. 08/14/22     amLODipine (NORVASC) 5 MG tablet Take 10 mg by mouth daily.  08/31/14   [provider]  azaTHIOprine (IMURAN) 50 MG tablet Take 50 mg by mouth 4 (four) times daily. 07/21/16   [provider]  fexofenadine-pseudoephedrine (ALLEGRA-D) 60-120 MG 12 hr tablet Take 1 tablet by mouth 2 (two) times daily. 07/05/19   Joni Reining, PA-C  fluticasone (FLONASE) 50 MCG/ACT nasal spray Place 2 sprays into both nostrils daily. 12/13/22     hydrochlorothiazide (HYDRODIURIL) 25 MG tablet Take 25 mg by mouth daily.  07/09/14 05/14/19  [provider]  inFLIXimab (REMICADE) 100 MG injection Inject 100 mg into the vein every 8 (eight) weeks.    [provider]  pantoprazole (PROTONIX) 40 MG tablet Take 40 mg by mouth daily.    [provider]  pantoprazole (PROTONIX) 40 MG tablet Take 1 tablet by mouth once daily 02/23/21     potassium chloride (KLOR-CON) 10 MEQ tablet Take 1 tablet (10 mEq total) by mouth daily. 09/04/19   Nita Sickle, MD  Upadacitinib ER [facility]) 30 MG TB24 Take 1 tablet (30 mg total) by mouth daily.  01/05/23   Quentin Angst, MD    Family History Family History  Problem Relation Age of Onset   Breast cancer Maternal Aunt    Breast cancer Cousin     Social History Social History   Tobacco Use   Smoking status: Never   Smokeless tobacco: Never  Vaping Use   Vaping status: Never Used  Substance Use Topics   Alcohol use: No   Drug use: No     Allergies   Bee venom and Ivp dye [iodinated contrast media]   Review of Systems Review of Systems: negative unless otherwise stated in HPI.      Physical Exam Triage Vital Signs ED Triage Vitals  Encounter Vitals Group     BP 01/21/23 1209 (!) 158/99     Systolic BP Percentile --      Diastolic BP Percentile --      Pulse Rate 01/21/23 1209 66     Resp 01/21/23  1209 16     Temp 01/21/23 1209 98.7 F (37.1 C)     Temp Source 01/21/23 1209 Oral     SpO2 01/21/23 1209 97 %     Weight 01/21/23 1207 235 lb 0.2 oz (106.6 kg)     Height 01/21/23 1207 5\' 5"  (1.651 m)     Head Circumference --      Peak Flow --      Pain Score 01/21/23 1206 8     Pain Loc --      Pain Education --      Exclude from Growth Chart --    No data found.  Updated Vital Signs BP (!) 158/99 (BP Location: Right Arm)   Pulse 66   Temp 98.7 F (37.1 C) (Oral)   Resp 16   Ht 5\' 5"  (1.651 m)   Wt 106.6 kg   SpO2 97%   BMI 39.11 kg/m   Visual Acuity Right Eye Distance:   Left Eye Distance:   Bilateral Distance:    Right Eye Near:   Left Eye Near:    Bilateral Near:     Physical Exam GEN: well appearing male in no acute distress  CVS: well perfused, regular rate and rhythm RESP: speaking in full sentences without pause, no respiratory distress, clear bilaterally GU: deferred, patient performed self swab  Lumbar spine: - Inspection: no gross deformity or asymmetry, swelling or ecchymosis. No skin changes - Palpation: No TTP over the spinous processes, +paraspinal muscles tenderness and hypertonicity, or SI joints b/l - ROM: Limited active ROM of the lumbar spine in flexion and extension secondary to pain - Strength: 5/5 strength of lower extremity in L4-S1 nerve root distributions b/l - Neuro: sensation intact in the L4-S1 nerve root distribution b/l, 2+ L4  reflexes - Special testing: Negative straight leg raise      UC Treatments / Results  Labs (all labs ordered are listed, but only abnormal results are displayed) Labs Reviewed  URINALYSIS, W/ REFLEX TO CULTURE (INFECTION SUSPECTED)    EKG   Radiology No results found.  Procedures Procedures (including critical care time)  Medications Ordered in UC Medications  ketorolac (TORADOL) injection 30 mg (30 mg Intramuscular Given 01/21/23 1300)    Initial Impression / Assessment and Plan / UC  Course  I have reviewed the triage vital signs and the nursing notes.  Pertinent labs & imaging results that were available during my care of the patient were reviewed by me and considered in my medical decision making (see  chart for details).     Spencer Heath is a 57 year old male who presents for acute on chronic low back pain.  On exam, he has hypertonicity and tenderness to the paraspinal muscles.  No midline tenderness therefore imaging deferred and patient is agreeable with this.  This feels like his typical back pain.  Requests pain injection as this has helped in the past.  Given Toradol 30 mg IM.  Discussed with patient gradually returning to normal activities, as tolerated. Pt to continue ordinary activities within the limits permitted by pain. Will prescribe Naproxen sodium and muscle relaxer  for pain relief.  Advised patient to avoid other NSAIDs while taking this medication. Counseled patient on red flag symptoms and when to seek immediate care. Patient to follow-up with spine specialist, if symptoms do not improve with conservative treatment.  ED precautions given.   He is hypertensive here BP 158/99.  He is prescribed amlodipine, losartan and hydrochlorothiazide.  Denies taking any refills.  Believes his blood pressure is elevated due to pain.  Advised him to take his blood pressure daily and follow-up with his primary care provider in 2 to 3 weeks.  Discussed MDM, treatment plan and plan for follow-up with patient who agrees with plan.        Final Clinical Impressions(s) / UC Diagnoses   Final diagnoses:  Strain of lumbar region, initial encounter     Discharge Instructions      If medication was prescribed, stop by the pharmacy to pick up your prescriptions.  For your back pain, Take 1500 mg Tylenol twice a day, take muscle relaxer (methocarbamol/Robaxin) twice a day, take Naprosyn twice a day,  as needed for pain.  Robaxin may make you sleepy.  Consider stopping by the  pharmacy or dollar store to pick up some Lidocaine patches. Apply for 12 hours and then remove.   Watch for worsening symptoms such as an increasing weakness or loss of sensation in your arms or legs, increasing pain and/or the loss of bladder or bowel function. Should any of these occur, go to the emergency department immediately.       ED Prescriptions     Medication Sig Dispense Auth. Provider   methocarbamol (ROBAXIN) 500 MG tablet Take 1 tablet (500 mg total) by mouth 2 (two) times daily. 20 tablet Beola Vasallo, DO   naproxen (NAPROSYN) 500 MG tablet Take 1 tablet (500 mg total) by mouth 2 (two) times daily with a meal. 30 tablet Arpi Diebold, DO      PDMP not reviewed this encounter.      Katha Cabal, DO 01/27/23 2354

## 2023-01-21 NOTE — ED Triage Notes (Signed)
Patient states that he woke up with lower back pain 3 days ago.  Patient denies injury or fall.  Patient reports some urinary frequency off and on.

## 2023-01-23 ENCOUNTER — Other Ambulatory Visit: Payer: Self-pay

## 2023-01-23 ENCOUNTER — Other Ambulatory Visit (HOSPITAL_COMMUNITY): Payer: Self-pay

## 2023-01-28 ENCOUNTER — Other Ambulatory Visit: Payer: Self-pay

## 2023-01-29 ENCOUNTER — Other Ambulatory Visit: Payer: Self-pay

## 2023-01-29 DIAGNOSIS — R208 Other disturbances of skin sensation: Secondary | ICD-10-CM | POA: Diagnosis not present

## 2023-01-29 DIAGNOSIS — D485 Neoplasm of uncertain behavior of skin: Secondary | ICD-10-CM | POA: Diagnosis not present

## 2023-01-29 DIAGNOSIS — L918 Other hypertrophic disorders of the skin: Secondary | ICD-10-CM | POA: Diagnosis not present

## 2023-01-29 MED ORDER — LOSARTAN POTASSIUM 100 MG PO TABS
100.0000 mg | ORAL_TABLET | Freq: Every day | ORAL | 1 refills | Status: DC
  Filled 2023-01-29: qty 90, 90d supply, fill #0
  Filled 2023-04-28: qty 90, 90d supply, fill #1

## 2023-02-03 ENCOUNTER — Encounter (HOSPITAL_COMMUNITY): Payer: Self-pay

## 2023-02-08 ENCOUNTER — Other Ambulatory Visit: Payer: Self-pay

## 2023-02-09 ENCOUNTER — Other Ambulatory Visit: Payer: Self-pay

## 2023-02-09 MED ORDER — POTASSIUM CHLORIDE CRYS ER 20 MEQ PO TBCR
20.0000 meq | EXTENDED_RELEASE_TABLET | Freq: Two times a day (BID) | ORAL | 3 refills | Status: DC
  Filled 2023-02-09: qty 90, 45d supply, fill #0
  Filled 2023-04-16: qty 90, 45d supply, fill #1
  Filled 2023-10-23: qty 90, 45d supply, fill #2

## 2023-02-09 MED ORDER — AMLODIPINE BESYLATE 10 MG PO TABS
10.0000 mg | ORAL_TABLET | Freq: Every day | ORAL | 1 refills | Status: DC
  Filled 2023-02-09: qty 90, 90d supply, fill #0
  Filled 2023-05-08: qty 90, 90d supply, fill #1

## 2023-02-22 ENCOUNTER — Other Ambulatory Visit (HOSPITAL_COMMUNITY): Payer: Self-pay | Admitting: Pharmacy Technician

## 2023-02-22 ENCOUNTER — Other Ambulatory Visit (HOSPITAL_COMMUNITY): Payer: Self-pay

## 2023-02-22 NOTE — Progress Notes (Signed)
Specialty Pharmacy Refill Coordination Note  Spencer Heath is a 57 y.o. male contacted today regarding refills of specialty medication(s) Upadacitinib   Patient requested Delivery   Delivery date: 03/01/23   Verified address: [address] [place]   Medication will be filled on 02/28/23.

## 2023-02-23 ENCOUNTER — Other Ambulatory Visit: Payer: Self-pay

## 2023-02-27 DIAGNOSIS — Z980 Intestinal bypass and anastomosis status: Secondary | ICD-10-CM | POA: Diagnosis not present

## 2023-02-27 DIAGNOSIS — G4733 Obstructive sleep apnea (adult) (pediatric): Secondary | ICD-10-CM | POA: Diagnosis not present

## 2023-02-27 DIAGNOSIS — M1990 Unspecified osteoarthritis, unspecified site: Secondary | ICD-10-CM | POA: Diagnosis not present

## 2023-02-27 DIAGNOSIS — Z91030 Bee allergy status: Secondary | ICD-10-CM | POA: Diagnosis not present

## 2023-02-27 DIAGNOSIS — K5080 Crohn's disease of both small and large intestine without complications: Secondary | ICD-10-CM | POA: Diagnosis not present

## 2023-02-27 DIAGNOSIS — Z91041 Radiographic dye allergy status: Secondary | ICD-10-CM | POA: Diagnosis not present

## 2023-02-27 DIAGNOSIS — R519 Headache, unspecified: Secondary | ICD-10-CM | POA: Diagnosis not present

## 2023-02-27 DIAGNOSIS — I10 Essential (primary) hypertension: Secondary | ICD-10-CM | POA: Diagnosis not present

## 2023-02-27 DIAGNOSIS — Z9989 Dependence on other enabling machines and devices: Secondary | ICD-10-CM | POA: Diagnosis not present

## 2023-02-27 DIAGNOSIS — K219 Gastro-esophageal reflux disease without esophagitis: Secondary | ICD-10-CM | POA: Diagnosis not present

## 2023-03-06 ENCOUNTER — Other Ambulatory Visit: Payer: Self-pay

## 2023-03-06 DIAGNOSIS — D049 Carcinoma in situ of skin, unspecified: Secondary | ICD-10-CM | POA: Diagnosis not present

## 2023-03-27 ENCOUNTER — Other Ambulatory Visit: Payer: Self-pay

## 2023-03-30 ENCOUNTER — Other Ambulatory Visit: Payer: Self-pay

## 2023-04-02 ENCOUNTER — Other Ambulatory Visit: Payer: Self-pay

## 2023-04-02 NOTE — Progress Notes (Signed)
Specialty Pharmacy Refill Coordination Note  Spencer Heath is a 57 y.o. male contacted today regarding refills of specialty medication(s) Upadacitinib   Patient requested Delivery   Delivery date: 04/04/23   Verified address: [address] [place]   Medication will be filled on 04/03/23.

## 2023-04-03 ENCOUNTER — Other Ambulatory Visit: Payer: Self-pay

## 2023-04-16 ENCOUNTER — Other Ambulatory Visit: Payer: Self-pay

## 2023-04-25 ENCOUNTER — Encounter (HOSPITAL_COMMUNITY): Payer: Self-pay

## 2023-04-25 ENCOUNTER — Other Ambulatory Visit: Payer: Self-pay

## 2023-04-25 NOTE — Progress Notes (Signed)
Specialty Pharmacy Refill Coordination Note  Spencer Heath is a 57 y.o. male contacted today regarding refills of specialty medication(s) Upadacitinib   Patient requested Delivery   Delivery date: 05/07/23   Verified address: [address] [place]   Medication will be filled on 05/04/23.

## 2023-04-25 NOTE — Progress Notes (Signed)
Specialty Pharmacy Ongoing Clinical Assessment Note  Spencer Heath is a 57 y.o. male who is being followed by the specialty pharmacy service for RxSp Crohn's Disease   Patient's specialty medication(s) reviewed today: Upadacitinib   Missed doses in the last 4 weeks: 0   Patient/Caregiver did not have any additional questions or concerns.   Therapeutic benefit summary: Patient is achieving benefit   Adverse events/side effects summary: No adverse events/side effects   Patient's therapy is appropriate to: Continue    Goals Addressed             This Visit's Progress    Reduce long-term complications       Patient is on track. Patient will maintain adherence         Follow up:  6 months  Otto Herb Specialty Pharmacist

## 2023-04-26 ENCOUNTER — Other Ambulatory Visit (HOSPITAL_COMMUNITY): Payer: Self-pay

## 2023-04-26 ENCOUNTER — Telehealth: Payer: Self-pay | Admitting: Pharmacist

## 2023-04-26 ENCOUNTER — Ambulatory Visit: Payer: 59 | Attending: Internal Medicine | Admitting: Pharmacist

## 2023-04-26 ENCOUNTER — Other Ambulatory Visit: Payer: Self-pay

## 2023-04-26 DIAGNOSIS — Z7189 Other specified counseling: Secondary | ICD-10-CM

## 2023-04-26 MED ORDER — RINVOQ 30 MG PO TB24: 1.0000 | ORAL_TABLET | Freq: Every day | ORAL | 0 refills | Status: DC

## 2023-04-26 MED ORDER — RINVOQ 30 MG PO TB24
1.0000 | ORAL_TABLET | Freq: Every day | ORAL | 0 refills | Status: DC
  Filled 2023-04-26: qty 30, 30d supply, fill #0

## 2023-04-26 NOTE — Telephone Encounter (Signed)
Opened in error

## 2023-04-26 NOTE — Progress Notes (Signed)
  S: Patient presents today for review of their specialty medication.   Patient is taking Rinvoq for Crohn's disease. Patient is managed by Dr. Gwenith Spitz for this.  Dosing: Adult   Crohn's disease: induction 45 mg once daily x12 weeks followed by 15 mg once daily (PO) for most patients. In some patients, may consider increased mt dose of 30 mg once daily.   Adherence: confirms  Efficacy: confirms  Monitoring: S/sx thromboembolism: none, no medical hx S/sx malignancy: none S/sx of infection:none   Current adverse effects: none    O:     Lab Results  Component Value Date   WBC 9.3 09/03/2019   HGB 12.7 (L) 09/03/2019   HCT 37.8 (L) 09/03/2019   MCV 81.5 09/03/2019   PLT 342 09/03/2019      Chemistry      Component Value Date/Time   NA 137 09/03/2019 1918   NA 142 10/15/2013 1822   K 2.6 (LL) 09/03/2019 1918   K 3.0 (L) 10/15/2013 1822   CL 96 (L) 09/03/2019 1918   CL 105 10/15/2013 1822   CO2 34 (H) 09/03/2019 1918   CO2 32 10/15/2013 1822   BUN 17 09/03/2019 1918   BUN 14 10/15/2013 1822   CREATININE 1.06 09/03/2019 1918   CREATININE 1.17 10/15/2013 1822      Component Value Date/Time   CALCIUM 8.9 09/03/2019 1918   CALCIUM 9.0 10/15/2013 1822   ALKPHOS 124 09/03/2019 1918   ALKPHOS 109 10/15/2013 1822   AST 18 09/03/2019 1918   AST 14 (L) 10/15/2013 1822   ALT 23 09/03/2019 1918   ALT 21 10/15/2013 1822   BILITOT 0.9 09/03/2019 1918   BILITOT 0.6 10/15/2013 1822       No results found for: "CHOL", "HDL", "LDLCALC", "LDLDIRECT", "TRIG", "CHOLHDL"   A/P: 1. Medication review: patient currently taking Rinvoq for Crohn's disease. Reviewed the medication with the patient, including the following: Rinvoq is a medication used to treat Crohn's. Administer with or without food. Swallow tablet whole; do not crush, split, or chew. Possible adverse effects include increased risk of infection, GI upset, hematologic toxicity, hepatic effects, lipid abnormalities,  increased risk of malignancy, thromboembolism. Avoid live vaccinations. No recommendations for any changes.  Butch Penny, PharmD, [place], CPP Clinical Pharmacist [facility] [phone]

## 2023-04-27 DIAGNOSIS — K50012 Crohn's disease of small intestine with intestinal obstruction: Secondary | ICD-10-CM | POA: Diagnosis not present

## 2023-05-04 ENCOUNTER — Other Ambulatory Visit: Payer: Self-pay

## 2023-05-07 ENCOUNTER — Other Ambulatory Visit: Payer: Self-pay

## 2023-05-07 MED ORDER — PHENTERMINE HCL 37.5 MG PO TABS
37.5000 mg | ORAL_TABLET | Freq: Every day | ORAL | 0 refills | Status: DC
  Filled 2023-05-07: qty 30, 30d supply, fill #0

## 2023-05-08 ENCOUNTER — Other Ambulatory Visit: Payer: Self-pay

## 2023-05-14 ENCOUNTER — Other Ambulatory Visit: Payer: Self-pay

## 2023-05-29 ENCOUNTER — Other Ambulatory Visit: Payer: Self-pay

## 2023-06-01 ENCOUNTER — Other Ambulatory Visit: Payer: Self-pay

## 2023-06-04 ENCOUNTER — Other Ambulatory Visit (HOSPITAL_COMMUNITY): Payer: Self-pay

## 2023-06-04 ENCOUNTER — Other Ambulatory Visit: Payer: Self-pay

## 2023-06-04 NOTE — Progress Notes (Signed)
Specialty Pharmacy Refill Coordination Note  Spencer Heath is a 58 y.o. male contacted today regarding refills of specialty medication(s) Upadacitinib (Rinvoq)   Patient requested Delivery   Delivery date: 06/06/23   Verified address: [address] [place]   Medication will be filled on 01.21.25 or when approved.

## 2023-06-05 ENCOUNTER — Other Ambulatory Visit: Payer: Self-pay | Admitting: Pharmacist

## 2023-06-05 ENCOUNTER — Other Ambulatory Visit: Payer: Self-pay

## 2023-06-05 MED ORDER — PHENTERMINE HCL 37.5 MG PO TABS
37.5000 mg | ORAL_TABLET | Freq: Every morning | ORAL | 0 refills | Status: AC
  Filled 2023-06-05: qty 30, 30d supply, fill #0

## 2023-06-05 MED ORDER — RINVOQ 30 MG PO TB24
1.0000 | ORAL_TABLET | Freq: Every day | ORAL | 4 refills | Status: DC
  Filled 2023-06-05: qty 30, 30d supply, fill #0
  Filled 2023-06-27: qty 30, 30d supply, fill #1
  Filled 2023-07-30: qty 30, 30d supply, fill #2
  Filled 2023-08-24: qty 30, 30d supply, fill #3
  Filled 2023-09-24: qty 30, 30d supply, fill #4

## 2023-06-05 MED ORDER — RINVOQ 30 MG PO TB24
1.0000 | ORAL_TABLET | Freq: Every day | ORAL | 4 refills | Status: DC
  Filled 2023-06-05 (×2): qty 30, 30d supply, fill #0

## 2023-06-22 ENCOUNTER — Other Ambulatory Visit (HOSPITAL_COMMUNITY): Payer: Self-pay

## 2023-06-26 ENCOUNTER — Other Ambulatory Visit: Payer: Self-pay

## 2023-06-27 ENCOUNTER — Other Ambulatory Visit: Payer: Self-pay

## 2023-06-27 ENCOUNTER — Other Ambulatory Visit (HOSPITAL_COMMUNITY): Payer: Self-pay

## 2023-06-27 DIAGNOSIS — K50012 Crohn's disease of small intestine with intestinal obstruction: Secondary | ICD-10-CM | POA: Diagnosis not present

## 2023-06-27 NOTE — Progress Notes (Signed)
Specialty Pharmacy Refill Coordination Note  Spencer Heath is a 58 y.o. male contacted today regarding refills of specialty medication(s) Upadacitinib (Rinvoq)   Patient requested Delivery   Delivery date: 07/03/23   Verified address: [address] [place]   Medication will be filled on 07/02/23.

## 2023-06-27 NOTE — Progress Notes (Signed)
Clinical Intervention Note  Clinical Intervention Notes: Patient reported starting 2 new medications: Phentermine & Ferrous Sulfate. Per Up to Date, no DDI. No further intervention required.   Clinical Intervention Outcomes: Prevention of an adverse drug event   Bobette Mo Specialty Pharmacist

## 2023-07-17 ENCOUNTER — Other Ambulatory Visit: Payer: Self-pay

## 2023-07-19 ENCOUNTER — Other Ambulatory Visit: Payer: Self-pay

## 2023-07-23 ENCOUNTER — Ambulatory Visit: Admission: EM | Admit: 2023-07-23 | Discharge: 2023-07-23 | Disposition: A | Discharge status: Home / Self Care

## 2023-07-23 DIAGNOSIS — R197 Diarrhea, unspecified: Secondary | ICD-10-CM

## 2023-07-23 DIAGNOSIS — A059 Bacterial foodborne intoxication, unspecified: Secondary | ICD-10-CM | POA: Diagnosis not present

## 2023-07-23 DIAGNOSIS — Z8719 Personal history of other diseases of the digestive system: Secondary | ICD-10-CM

## 2023-07-23 DIAGNOSIS — I10 Essential (primary) hypertension: Secondary | ICD-10-CM

## 2023-07-23 NOTE — ED Triage Notes (Signed)
 Pt c/o diarrhea & chills x1 day. States had a ACC from [place] & sx's started after. Hx of crohn's.

## 2023-07-23 NOTE — ED Provider Notes (Signed)
 [facility]    CSN: [id number] Arrival date & time: 07/23/23  1140      History   Chief Complaint Chief Complaint  Patient presents with   Diarrhea    HPI Spencer Heath is a 58 y.o. male with history of Crohn's disease, GERD, arthritis, and hypertension. Patient is currently on Rinvoq, Remicade, and Imuran for Crohn's, pantoprazole for GERD, and hydrochlorothiazide, losartan, and amlodipine for HTN.   Today, he reports loose watery diarrhea, decreased p.o. intake, chills and fatigue that began soon after eating shrimp from a [employer] last night.  Denies abdominal pain, blood in stool, abdominal swelling, nausea or vomiting.   Denies fever, cough, congestion, sore throat, difficulty swallowing, chest pain, wheezing or shortness of breath, palpitations, constipation, dysuria, frequency, urgency, flank pain.  Denies any sick contacts.  No recent travel.   Patient does not believe his symptoms are related to Crohn's. No other concerns.  Has been drinking ginger ale and water. Not eating much.    HPI  Past Medical History:  Diagnosis Date   Arthritis    Crohn's disease (HCC)    GERD (gastroesophageal reflux disease)    Hypertension     Patient Active Problem List   Diagnosis Date Noted   Small bowel obstruction (HCC) 01/24/2015   Crohn's disease (HCC) 01/24/2015    Past Surgical History:  Procedure Laterality Date   ABDOMINAL SURGERY     APPENDECTOMY     DORSAL COMPARTMENT RELEASE Right 05/14/2019   Procedure: RELEASE DORSAL COMPARTMENT (DEQUERVAIN);  Surgeon: Christena Flake, MD;  Location: [facility] ORS;  Service: Orthopedics;  Laterality: Right;   testical removed     TESTICLE SURGERY         Home Medications    Prior to Admission medications   Medication Sig Start Date End Date Taking? Authorizing Provider  amLODipine (NORVASC) 10 MG tablet Take 1 tablet (10 mg total) by mouth daily. 02/09/23  Yes   amLODipine (NORVASC) 5 MG tablet Take  10 mg by mouth daily.  08/31/14  Yes [provider]  azaTHIOprine (IMURAN) 50 MG tablet Take 50 mg by mouth 4 (four) times daily. 07/21/16  Yes [provider]  fluticasone (FLONASE) 50 MCG/ACT nasal spray Place 2 sprays into both nostrils daily. 12/13/22  Yes   hydrochlorothiazide (HYDRODIURIL) 25 MG tablet Take 25 mg by mouth daily.  07/09/14 07/23/23 Yes [provider]  inFLIXimab (REMICADE) 100 MG injection Inject 100 mg into the vein every 8 (eight) weeks.   Yes [provider]  losartan (COZAAR) 100 MG tablet Take 1 tablet (100 mg total) by mouth daily. 01/29/23  Yes   pantoprazole (PROTONIX) 40 MG tablet Take 40 mg by mouth daily.   Yes [provider]  pantoprazole (PROTONIX) 40 MG tablet Take 1 tablet by mouth once daily 02/23/21  Yes   phentermine (ADIPEX-P) 37.5 MG tablet Take 1 tablet (37.5 mg total) by mouth every morning before breakfast 06/05/23  Yes   potassium chloride (KLOR-CON) 10 MEQ tablet Take 1 tablet (10 mEq total) by mouth daily. 09/04/19  Yes Don Perking, [place], MD  potassium chloride SA (KLOR-CON M) 20 MEQ tablet Take 1 tablet (20 mEq total) by mouth 2 (two) times daily 02/09/23  Yes   Upadacitinib ER (RINVOQ) 30 MG TB24 Take 1 tablet (30 mg total) by mouth daily. 06/05/23  Yes Jegede, Phylliss Blakes, MD  fexofenadine-pseudoephedrine (ALLEGRA-D) 60-120 MG 12 hr tablet Take 1 tablet by mouth 2 (two) times daily.  07/05/19   Joni Reining, PA-C    Family History Family History  Problem Relation Age of Onset   Breast cancer Maternal Aunt    Breast cancer Cousin     Social History Social History   Tobacco Use   Smoking status: Never   Smokeless tobacco: Never  Vaping Use   Vaping status: Never Used  Substance Use Topics   Alcohol use: No   Drug use: No     Allergies   Bee venom and Ivp dye [iodinated contrast media]   Review of Systems Review of Systems  Constitutional:  Positive for appetite change, chills and  fatigue. Negative for diaphoresis and fever.  HENT:  Negative for congestion, rhinorrhea and sore throat.   Respiratory:  Negative for cough and shortness of breath.   Cardiovascular:  Negative for chest pain.  Gastrointestinal:  Positive for diarrhea. Negative for abdominal distention, abdominal pain, blood in stool, constipation, nausea, rectal pain and vomiting.  Neurological:  Negative for weakness.     Physical Exam Triage Vital Signs ED Triage Vitals  Encounter Vitals Group     BP 07/23/23 1312 (!) 175/103     Systolic BP Percentile --      Diastolic BP Percentile --      Pulse Rate 07/23/23 1312 100     Resp 07/23/23 1312 16     Temp 07/23/23 1312 99.6 F (37.6 C)     Temp Source 07/23/23 1312 Oral     SpO2 07/23/23 1312 95 %     Weight --      Height 07/23/23 1312 5\' 5"  (1.651 m)     Head Circumference --      Peak Flow --      Pain Score 07/23/23 1317 0     Pain Loc --      Pain Education --      Exclude from Growth Chart --    No data found.  Updated Vital Signs BP (!) 175/103 (BP Location: Right Arm)   Pulse 100   Temp 99.6 F (37.6 C) (Oral)   Resp 16   Ht 5\' 5"  (1.651 m)   SpO2 95%   BMI 39.11 kg/m      Physical Exam Vitals and nursing note reviewed.  Constitutional:      General: He is not in acute distress.    Appearance: Normal appearance. He is well-developed. He is obese. He is not ill-appearing.  HENT:     Head: Normocephalic and atraumatic.     Nose: Nose normal.     Mouth/Throat:     Mouth: Mucous membranes are moist.     Pharynx: Oropharynx is clear.  Eyes:     General: No scleral icterus.    Conjunctiva/sclera: Conjunctivae normal.  Cardiovascular:     Rate and Rhythm: Normal rate and regular rhythm.  Pulmonary:     Effort: Pulmonary effort is normal. No respiratory distress.     Breath sounds: Normal breath sounds.  Abdominal:     Palpations: Abdomen is soft.     Tenderness: There is no abdominal tenderness. There is no  guarding or rebound.  Musculoskeletal:     Cervical back: Neck supple.  Skin:    General: Skin is warm and dry.     Capillary Refill: Capillary refill takes less than 2 seconds.  Neurological:     General: No focal deficit present.     Mental Status: He is alert. Mental status is at baseline.  Motor: No weakness.     Gait: Gait normal.  Psychiatric:        Mood and Affect: Mood normal.        Behavior: Behavior normal.      UC Treatments / Results  Labs (all labs ordered are listed, but only abnormal results are displayed) Labs Reviewed - No data to display  EKG   Radiology No results found.  Procedures Procedures (including critical care time)  Medications Ordered in UC Medications - No data to display  Initial Impression / Assessment and Plan / UC Course  I have reviewed the triage vital signs and the nursing notes.  Pertinent labs & imaging results that were available during my care of the patient were reviewed by me and considered in my medical decision making (see chart for details).   58 year old male presents for evaluation of watery diarrhea, chills, fatigue, and decreased PO intake that began today. No fever, blood in stool, or abdominal pain.  History of Crohn's but states it is well managed with medication and he does not feel that current symptoms are consistent with a Crohn's flare up.    On exam patient appears well and vitals notable for mild tachycardia with heart rate to 100, respirations of 16.  BP 173/103. He is afebrile. No abnormal HEENT findings, clear lung sounds auscultation all fields, no tenderness abdomen and no guarding/rebound tenderness. No CVA tenderness.. Cardiac exam unremarkable with exception of the tachycardia and a normal S1-S2 with no notable arrhythmias, abnormal beats, murmurs.    Differentials include food poisoning, gastritis versus peptic ulcer versus cholecystitis versus pancreatitis versus Crohn's exacerbation versus other  infectious causes/viral gastroenteritis.    Discussed with patient that we are limited in urgent care.  Explained that his symptoms may be consistent with food poisoning versus viral gastroenteritis.  Advised supportive care.  Encouraged increasing rest and fluids and replenish electrolytes.  Ice Tylenol as needed for discomfort.  Explained the symptoms should be getting better over the next 24 to 48 hours.  Explained if they are not, he has fever, abdominal pain, black or bloody stool, abdominal swelling, weakness he should go to the emergency department right away for further workup including labs and imaging.  Patient is understanding and agreeable to plan.  Reviewed that BP is high.  Continue to check blood pressure at home and if consistently greater than 140/90 he should follow-up with primary care.  Keep all follow-ups as scheduled with GI.   Final Clinical Impressions(s) / UC Diagnoses   Final diagnoses:  Food poisoning  Diarrhea, unspecified type  History of Crohn's disease  Essential hypertension     Discharge Instructions      Symptoms appear to be consistent with food poisoning or possible stomach virus.  You may take Tylenol for pain relief. Use medications as directed including antiemetics and antidiarrheal medications if suggested or prescribed.  Avoid antidiarrhea medications at this time as your body should clear out the infection.  You should increase fluids and electrolytes as well as rest over these next several days.  Consider Pedialyte or Gatorade.  If you have any questions or concerns, or if your symptoms are not improving or if especially if they acutely worsen, please call or stop back to the clinic immediately and we will be happy to help you or go to the ER.    RED FLAGS: Seek immediate further care if: symptoms remain the same or worsen over the next 24-48 hours, you are unable to  keep fluids down, you see blood or mucus in your stool, you vomit black or dark red  material, you have a fever of 101.F or higher, you have localized and/or persistent abdominal pain    Blood pressure is high.  Continue taking your blood pressure medications at home.  If consistently greater than 140/90 please follow-up with your PCP about medication changes.     ED Prescriptions   None    PDMP not reviewed this encounter.   Shirlee Latch, PA-C 07/23/23 [phone]

## 2023-07-23 NOTE — Discharge Instructions (Addendum)
 Symptoms appear to be consistent with food poisoning or possible stomach virus.  You may take Tylenol for pain relief. Use medications as directed including antiemetics and antidiarrheal medications if suggested or prescribed.  Avoid antidiarrhea medications at this time as your body should clear out the infection.  You should increase fluids and electrolytes as well as rest over these next several days.  Consider Pedialyte or Gatorade.  If you have any questions or concerns, or if your symptoms are not improving or if especially if they acutely worsen, please call or stop back to the clinic immediately and we will be happy to help you or go to the ER.    RED FLAGS: Seek immediate further care if: symptoms remain the same or worsen over the next 24-48 hours, you are unable to keep fluids down, you see blood or mucus in your stool, you vomit black or dark red material, you have a fever of 101.F or higher, you have localized and/or persistent abdominal pain    Blood pressure is high.  Continue taking your blood pressure medications at home.  If consistently greater than 140/90 please follow-up with your PCP about medication changes.

## 2023-07-25 ENCOUNTER — Other Ambulatory Visit: Payer: Self-pay

## 2023-07-26 ENCOUNTER — Other Ambulatory Visit: Payer: Self-pay

## 2023-07-27 ENCOUNTER — Other Ambulatory Visit: Payer: Self-pay

## 2023-07-27 MED ORDER — LOSARTAN POTASSIUM 100 MG PO TABS
100.0000 mg | ORAL_TABLET | Freq: Every day | ORAL | 1 refills | Status: DC
  Filled 2023-07-27: qty 90, 90d supply, fill #0
  Filled 2023-10-23: qty 90, 90d supply, fill #1

## 2023-07-30 ENCOUNTER — Other Ambulatory Visit: Payer: Self-pay

## 2023-07-30 NOTE — Progress Notes (Signed)
 Specialty Pharmacy Refill Coordination Note  Spencer Heath is a 58 y.o. male contacted today regarding refills of specialty medication(s) Upadacitinib (Rinvoq)   Patient requested Delivery   Delivery date: 08/01/23   Verified address: [address]   [name] [address]   Medication will be filled on 07/31/23.

## 2023-08-13 ENCOUNTER — Other Ambulatory Visit: Payer: Self-pay

## 2023-08-13 MED ORDER — AMLODIPINE BESYLATE 10 MG PO TABS
10.0000 mg | ORAL_TABLET | Freq: Every day | ORAL | 1 refills | Status: DC
  Filled 2023-08-13: qty 90, 90d supply, fill #0
  Filled 2023-11-15: qty 90, 90d supply, fill #1

## 2023-08-22 ENCOUNTER — Other Ambulatory Visit: Payer: Self-pay

## 2023-08-24 ENCOUNTER — Other Ambulatory Visit: Payer: Self-pay

## 2023-08-24 NOTE — Progress Notes (Signed)
 Specialty Pharmacy Refill Coordination Note  Spencer Heath is a 58 y.o. male contacted today regarding refills of specialty medication(s) Upadacitinib (Rinvoq)   Patient requested Delivery   Delivery date: 08/31/23   Verified address: [address]   [name] [address]   Medication will be filled on 08/30/23.

## 2023-08-30 ENCOUNTER — Other Ambulatory Visit: Payer: Self-pay

## 2023-08-30 DIAGNOSIS — E538 Deficiency of other specified B group vitamins: Secondary | ICD-10-CM | POA: Diagnosis not present

## 2023-08-30 DIAGNOSIS — Z79899 Other long term (current) drug therapy: Secondary | ICD-10-CM | POA: Diagnosis not present

## 2023-08-30 DIAGNOSIS — D500 Iron deficiency anemia secondary to blood loss (chronic): Secondary | ICD-10-CM | POA: Diagnosis not present

## 2023-08-30 DIAGNOSIS — A09 Infectious gastroenteritis and colitis, unspecified: Secondary | ICD-10-CM | POA: Diagnosis not present

## 2023-08-30 DIAGNOSIS — K50012 Crohn's disease of small intestine with intestinal obstruction: Secondary | ICD-10-CM | POA: Diagnosis not present

## 2023-08-30 MED ORDER — COLESTIPOL HCL 1 G PO TABS
1.0000 g | ORAL_TABLET | Freq: Three times a day (TID) | ORAL | 11 refills | Status: AC
  Filled 2023-08-30 – 2024-06-11 (×2): qty 180, 30d supply, fill #0

## 2023-08-30 NOTE — Progress Notes (Signed)
 Patient was contacted regarding his specialty medication Rinvoq. His current copay is $1,409.91, Per coupon patient is to call [employer] copay support with copay card ID at [phone].   Patient to follow up with pharmacy.

## 2023-08-30 NOTE — Progress Notes (Signed)
 Patient spoke to [employer] who stated to reprocess in 24 hours. Also provided patient with debit card. Will reprocess and ship 4/18 as long as payment goes through, patient aware.

## 2023-08-31 ENCOUNTER — Other Ambulatory Visit: Payer: Self-pay

## 2023-09-04 ENCOUNTER — Other Ambulatory Visit: Payer: Self-pay

## 2023-09-04 MED ORDER — BUDESONIDE 3 MG PO CPEP
9.0000 mg | ORAL_CAPSULE | Freq: Every morning | ORAL | 0 refills | Status: AC
  Filled 2023-09-04: qty 270, 90d supply, fill #0

## 2023-09-13 ENCOUNTER — Other Ambulatory Visit: Payer: Self-pay

## 2023-09-13 DIAGNOSIS — D2272 Melanocytic nevi of left lower limb, including hip: Secondary | ICD-10-CM | POA: Diagnosis not present

## 2023-09-13 DIAGNOSIS — L821 Other seborrheic keratosis: Secondary | ICD-10-CM | POA: Diagnosis not present

## 2023-09-13 DIAGNOSIS — D2271 Melanocytic nevi of right lower limb, including hip: Secondary | ICD-10-CM | POA: Diagnosis not present

## 2023-09-13 DIAGNOSIS — D2261 Melanocytic nevi of right upper limb, including shoulder: Secondary | ICD-10-CM | POA: Diagnosis not present

## 2023-09-13 DIAGNOSIS — D2262 Melanocytic nevi of left upper limb, including shoulder: Secondary | ICD-10-CM | POA: Diagnosis not present

## 2023-09-13 DIAGNOSIS — D225 Melanocytic nevi of trunk: Secondary | ICD-10-CM | POA: Diagnosis not present

## 2023-09-13 DIAGNOSIS — L730 Acne keloid: Secondary | ICD-10-CM | POA: Diagnosis not present

## 2023-09-13 MED ORDER — CLINDAMYCIN PHOSPHATE 1 % EX SOLN
1.0000 | Freq: Two times a day (BID) | CUTANEOUS | 5 refills | Status: AC | PRN
Start: 2023-09-13 — End: ?
  Filled 2023-09-13: qty 30, 15d supply, fill #0

## 2023-09-18 DIAGNOSIS — K50012 Crohn's disease of small intestine with intestinal obstruction: Secondary | ICD-10-CM | POA: Diagnosis not present

## 2023-09-20 ENCOUNTER — Other Ambulatory Visit: Payer: Self-pay

## 2023-09-21 ENCOUNTER — Other Ambulatory Visit: Payer: Self-pay

## 2023-09-21 MED ORDER — RINVOQ 30 MG PO TB24: 1.0000 | ORAL_TABLET | Freq: Every day | ORAL | 0 refills | Status: DC

## 2023-09-24 ENCOUNTER — Other Ambulatory Visit: Payer: Self-pay

## 2023-09-24 NOTE — Progress Notes (Signed)
 Specialty Pharmacy Refill Coordination Note  Spencer Heath is a 58 y.o. male contacted today regarding refills of specialty medication(s) Upadacitinib  (Rinvoq )   Patient requested Delivery   Delivery date: 09/27/23   Verified address: [address]   [place] [address]   Medication will be filled on 05.14.25.

## 2023-09-27 ENCOUNTER — Other Ambulatory Visit: Payer: Self-pay

## 2023-09-27 NOTE — Progress Notes (Signed)
 Clinical Intervention Note  Clinical Intervention Notes: Patient reports initating Budesonide  EC 3mg  capsules. Possible DDI between this an Rinvoq  identified but refers more to short acting corticosteriods in the case of possible GI perforation. Both medications being prescribed by Gastroenterologist and budesonide  is short term use for 1 month with 1 refill. Patient not at significant risk for ADE.   Clinical Intervention Outcomes: Prevention of an adverse drug event   Rena Carnes Specialty Pharmacist

## 2023-10-22 DIAGNOSIS — K50919 Crohn's disease, unspecified, with unspecified complications: Secondary | ICD-10-CM | POA: Diagnosis not present

## 2023-10-22 DIAGNOSIS — D649 Anemia, unspecified: Secondary | ICD-10-CM | POA: Diagnosis not present

## 2023-10-22 DIAGNOSIS — R0683 Snoring: Secondary | ICD-10-CM | POA: Diagnosis not present

## 2023-10-22 DIAGNOSIS — I10 Essential (primary) hypertension: Secondary | ICD-10-CM | POA: Diagnosis not present

## 2023-10-22 DIAGNOSIS — R5383 Other fatigue: Secondary | ICD-10-CM | POA: Diagnosis not present

## 2023-10-22 DIAGNOSIS — G4733 Obstructive sleep apnea (adult) (pediatric): Secondary | ICD-10-CM | POA: Diagnosis not present

## 2023-10-22 DIAGNOSIS — Z6841 Body Mass Index (BMI) 40.0 and over, adult: Secondary | ICD-10-CM | POA: Diagnosis not present

## 2023-10-22 DIAGNOSIS — R5381 Other malaise: Secondary | ICD-10-CM | POA: Diagnosis not present

## 2023-10-22 DIAGNOSIS — R7309 Other abnormal glucose: Secondary | ICD-10-CM | POA: Diagnosis not present

## 2023-10-23 ENCOUNTER — Other Ambulatory Visit: Payer: Self-pay

## 2023-10-24 ENCOUNTER — Other Ambulatory Visit: Payer: Self-pay

## 2023-10-24 ENCOUNTER — Other Ambulatory Visit: Payer: Self-pay | Admitting: Pharmacist

## 2023-10-24 MED ORDER — RINVOQ 30 MG PO TB24
1.0000 | ORAL_TABLET | Freq: Every day | ORAL | 0 refills | Status: DC
  Filled 2023-10-24: qty 30, 30d supply, fill #0
  Filled 2023-11-22 – 2023-11-23 (×2): qty 30, 30d supply, fill #1

## 2023-10-25 ENCOUNTER — Other Ambulatory Visit: Payer: Self-pay

## 2023-10-25 NOTE — Progress Notes (Signed)
 Specialty Pharmacy Ongoing Clinical Assessment Note  Spencer Heath is a 58 y.o. male who is being followed by the specialty pharmacy service for RxSp Crohn's Disease   Patient's specialty medication(s) reviewed today: Upadacitinib  (Rinvoq )   Missed doses in the last 4 weeks: 0   Patient/Caregiver did not have any additional questions or concerns.   Therapeutic benefit summary: Patient is achieving benefit   Adverse events/side effects summary: No adverse events/side effects   Patient's therapy is appropriate to: Continue    Goals Addressed             This Visit's Progress    Reduce long-term complications   On track    Patient is on track. Patient will maintain adherence. Pt had food poisoning in April that had exacerbated symptoms, but feeling better. Per visit on 08/30/23, pt stable, plan to repeat colonoscopy in October.         Follow up: 12 months  [facility]

## 2023-10-25 NOTE — Progress Notes (Signed)
 Specialty Pharmacy Refill Coordination Note  Spencer Heath is a 58 y.o. male contacted today regarding refills of specialty medication(s) Upadacitinib  (Rinvoq )   Patient requested Delivery   Delivery date: 10/26/23   Verified address: [address]   [place] [address]   Medication will be filled on 10/25/23.

## 2023-10-26 ENCOUNTER — Other Ambulatory Visit: Payer: Self-pay

## 2023-10-29 ENCOUNTER — Other Ambulatory Visit: Payer: Self-pay

## 2023-10-29 DIAGNOSIS — J3489 Other specified disorders of nose and nasal sinuses: Secondary | ICD-10-CM | POA: Diagnosis not present

## 2023-10-29 DIAGNOSIS — Z1339 Encounter for screening examination for other mental health and behavioral disorders: Secondary | ICD-10-CM | POA: Diagnosis not present

## 2023-10-29 DIAGNOSIS — G4733 Obstructive sleep apnea (adult) (pediatric): Secondary | ICD-10-CM | POA: Diagnosis not present

## 2023-10-29 DIAGNOSIS — K50918 Crohn's disease, unspecified, with other complication: Secondary | ICD-10-CM | POA: Diagnosis not present

## 2023-10-29 DIAGNOSIS — E876 Hypokalemia: Secondary | ICD-10-CM | POA: Diagnosis not present

## 2023-10-29 DIAGNOSIS — Z1331 Encounter for screening for depression: Secondary | ICD-10-CM | POA: Diagnosis not present

## 2023-10-29 DIAGNOSIS — K219 Gastro-esophageal reflux disease without esophagitis: Secondary | ICD-10-CM | POA: Diagnosis not present

## 2023-10-29 DIAGNOSIS — D649 Anemia, unspecified: Secondary | ICD-10-CM | POA: Diagnosis not present

## 2023-10-29 DIAGNOSIS — Z0000 Encounter for general adult medical examination without abnormal findings: Secondary | ICD-10-CM | POA: Diagnosis not present

## 2023-10-29 DIAGNOSIS — I10 Essential (primary) hypertension: Secondary | ICD-10-CM | POA: Diagnosis not present

## 2023-10-29 MED ORDER — POTASSIUM CHLORIDE ER 10 MEQ PO TBCR
10.0000 meq | EXTENDED_RELEASE_TABLET | Freq: Every day | ORAL | 1 refills | Status: DC
  Filled 2023-10-29: qty 90, 90d supply, fill #0
  Filled 2024-01-28: qty 90, 90d supply, fill #1

## 2023-10-29 MED ORDER — AZELASTINE HCL 0.1 % NA SOLN
1.0000 | Freq: Two times a day (BID) | NASAL | 5 refills | Status: AC
  Filled 2023-10-29 – 2024-06-11 (×2): qty 30, 30d supply, fill #0

## 2023-10-29 MED ORDER — HYDRALAZINE HCL 25 MG PO TABS
25.0000 mg | ORAL_TABLET | Freq: Two times a day (BID) | ORAL | 1 refills | Status: DC
  Filled 2023-10-29: qty 180, 90d supply, fill #0
  Filled 2024-01-28: qty 180, 90d supply, fill #1

## 2023-10-29 MED ORDER — FLUTICASONE PROPIONATE 50 MCG/ACT NA SUSP
2.0000 | Freq: Every day | NASAL | 5 refills | Status: AC
  Filled 2023-10-29 – 2024-06-11 (×2): qty 16, 30d supply, fill #0

## 2023-11-12 ENCOUNTER — Other Ambulatory Visit (HOSPITAL_COMMUNITY): Payer: Self-pay

## 2023-11-22 ENCOUNTER — Other Ambulatory Visit: Payer: Self-pay

## 2023-11-22 ENCOUNTER — Telehealth (HOSPITAL_COMMUNITY): Payer: Self-pay

## 2023-11-22 ENCOUNTER — Other Ambulatory Visit (HOSPITAL_COMMUNITY): Payer: Self-pay

## 2023-11-22 NOTE — Telephone Encounter (Signed)
 Pharmacy Patient Advocate Encounter   Received notification from Patient Pharmacy that prior authorization for Rinvoq  is required/requested.   Insurance verification completed.   The patient is insured through [facility] .   Per test claim: PA required; PA submitted to above mentioned insurance via CoverMyMeds Key/confirmation #/EOC [facility] Status is pending

## 2023-11-23 ENCOUNTER — Other Ambulatory Visit: Payer: Self-pay

## 2023-11-23 NOTE — Progress Notes (Signed)
 Specialty Pharmacy Refill Coordination Note  MIKING USREY is a 58 y.o. male contacted today regarding refills of specialty medication(s) Upadacitinib  (Rinvoq )   Patient requested Delivery   Delivery date: 12/04/23   Verified address: [address]   [place] [address]   Medication will be filled on 12/03/23.

## 2023-11-23 NOTE — Telephone Encounter (Signed)
 Pharmacy Patient Advocate Encounter  Received notification from [facility] that Prior Authorization for Rinvoq  has been APPROVED from 11/22/23 to 11/20/24   PA #/Case ID/Reference #: [facility]

## 2023-11-28 ENCOUNTER — Other Ambulatory Visit (HOSPITAL_COMMUNITY): Payer: Self-pay

## 2023-12-03 ENCOUNTER — Other Ambulatory Visit: Payer: Self-pay

## 2023-12-12 DIAGNOSIS — K50012 Crohn's disease of small intestine with intestinal obstruction: Secondary | ICD-10-CM | POA: Diagnosis not present

## 2023-12-18 DIAGNOSIS — G4733 Obstructive sleep apnea (adult) (pediatric): Secondary | ICD-10-CM | POA: Diagnosis not present

## 2023-12-18 DIAGNOSIS — J301 Allergic rhinitis due to pollen: Secondary | ICD-10-CM | POA: Diagnosis not present

## 2023-12-18 DIAGNOSIS — H6063 Unspecified chronic otitis externa, bilateral: Secondary | ICD-10-CM | POA: Diagnosis not present

## 2023-12-20 ENCOUNTER — Other Ambulatory Visit (HOSPITAL_COMMUNITY): Payer: Self-pay

## 2023-12-20 ENCOUNTER — Other Ambulatory Visit: Payer: Self-pay | Admitting: Pharmacist

## 2023-12-20 ENCOUNTER — Other Ambulatory Visit: Payer: Self-pay

## 2023-12-20 MED ORDER — RINVOQ 30 MG PO TB24
1.0000 | ORAL_TABLET | Freq: Every day | ORAL | 0 refills | Status: DC
  Filled 2023-12-20: qty 90, 90d supply, fill #0
  Filled 2023-12-26 (×2): qty 30, 30d supply, fill #0
  Filled 2024-01-31: qty 30, 30d supply, fill #1
  Filled 2024-03-03: qty 30, 30d supply, fill #2

## 2023-12-20 MED ORDER — RINVOQ 30 MG PO TB24: 1.0000 | ORAL_TABLET | Freq: Every day | ORAL | 0 refills | Status: DC

## 2023-12-21 ENCOUNTER — Other Ambulatory Visit (HOSPITAL_COMMUNITY): Payer: Self-pay

## 2023-12-26 ENCOUNTER — Other Ambulatory Visit: Payer: Self-pay

## 2023-12-28 ENCOUNTER — Other Ambulatory Visit: Payer: Self-pay

## 2023-12-28 NOTE — Progress Notes (Signed)
 Specialty Pharmacy Refill Coordination Note  Spencer Heath is a 58 y.o. male contacted today regarding refills of specialty medication(s) Upadacitinib  (Rinvoq )   Patient requested Delivery   Delivery date: 01/01/24   Verified address: [address]   [place] [address]   Medication will be filled on 12/31/23.

## 2023-12-31 ENCOUNTER — Other Ambulatory Visit: Payer: Self-pay

## 2024-01-23 ENCOUNTER — Other Ambulatory Visit (HOSPITAL_COMMUNITY): Payer: Self-pay

## 2024-01-28 ENCOUNTER — Other Ambulatory Visit: Payer: Self-pay

## 2024-01-28 MED ORDER — LOSARTAN POTASSIUM 100 MG PO TABS
100.0000 mg | ORAL_TABLET | Freq: Every day | ORAL | 1 refills | Status: AC
  Filled 2024-01-28: qty 90, 90d supply, fill #0
  Filled 2024-04-25: qty 90, 90d supply, fill #1

## 2024-01-31 ENCOUNTER — Other Ambulatory Visit: Payer: Self-pay

## 2024-01-31 ENCOUNTER — Other Ambulatory Visit: Payer: Self-pay | Admitting: Pharmacy Technician

## 2024-01-31 NOTE — Progress Notes (Signed)
 Specialty Pharmacy Refill Coordination Note  KAL CHAIT is a 58 y.o. male contacted today regarding refills of specialty medication(s) Upadacitinib  (Rinvoq )   Patient requested Delivery   Delivery date: 02/06/24   Verified address: [address] [place]   Medication will be filled on 02/05/24.

## 2024-02-08 ENCOUNTER — Other Ambulatory Visit: Payer: Self-pay

## 2024-02-11 ENCOUNTER — Other Ambulatory Visit: Payer: Self-pay

## 2024-02-11 MED ORDER — AMLODIPINE BESYLATE 10 MG PO TABS
10.0000 mg | ORAL_TABLET | Freq: Every day | ORAL | 1 refills | Status: AC
  Filled 2024-02-11: qty 90, 90d supply, fill #0
  Filled 2024-05-13: qty 90, 90d supply, fill #1

## 2024-02-21 DIAGNOSIS — K5090 Crohn's disease, unspecified, without complications: Secondary | ICD-10-CM | POA: Diagnosis not present

## 2024-02-26 DIAGNOSIS — G4733 Obstructive sleep apnea (adult) (pediatric): Secondary | ICD-10-CM | POA: Diagnosis not present

## 2024-02-26 DIAGNOSIS — K50918 Crohn's disease, unspecified, with other complication: Secondary | ICD-10-CM | POA: Diagnosis not present

## 2024-02-26 DIAGNOSIS — E876 Hypokalemia: Secondary | ICD-10-CM | POA: Diagnosis not present

## 2024-02-26 DIAGNOSIS — K219 Gastro-esophageal reflux disease without esophagitis: Secondary | ICD-10-CM | POA: Diagnosis not present

## 2024-02-26 DIAGNOSIS — J3489 Other specified disorders of nose and nasal sinuses: Secondary | ICD-10-CM | POA: Diagnosis not present

## 2024-02-26 DIAGNOSIS — I10 Essential (primary) hypertension: Secondary | ICD-10-CM | POA: Diagnosis not present

## 2024-02-26 DIAGNOSIS — D649 Anemia, unspecified: Secondary | ICD-10-CM | POA: Diagnosis not present

## 2024-02-26 DIAGNOSIS — Z0000 Encounter for general adult medical examination without abnormal findings: Secondary | ICD-10-CM | POA: Diagnosis not present

## 2024-03-03 ENCOUNTER — Other Ambulatory Visit: Payer: Self-pay

## 2024-03-03 NOTE — Progress Notes (Signed)
 Specialty Pharmacy Refill Coordination Note  Spencer Heath is a 58 y.o. male contacted today regarding refills of specialty medication(s) Upadacitinib  (Rinvoq )   Patient requested Delivery   Delivery date: 03/07/24   Verified address: [address] [place]   Medication will be filled on 03/06/24.

## 2024-03-04 ENCOUNTER — Other Ambulatory Visit: Payer: Self-pay

## 2024-03-04 DIAGNOSIS — K219 Gastro-esophageal reflux disease without esophagitis: Secondary | ICD-10-CM | POA: Diagnosis not present

## 2024-03-04 DIAGNOSIS — D649 Anemia, unspecified: Secondary | ICD-10-CM | POA: Diagnosis not present

## 2024-03-04 DIAGNOSIS — I10 Essential (primary) hypertension: Secondary | ICD-10-CM | POA: Diagnosis not present

## 2024-03-04 DIAGNOSIS — Z6841 Body Mass Index (BMI) 40.0 and over, adult: Secondary | ICD-10-CM | POA: Diagnosis not present

## 2024-03-04 DIAGNOSIS — K50919 Crohn's disease, unspecified, with unspecified complications: Secondary | ICD-10-CM | POA: Diagnosis not present

## 2024-03-04 DIAGNOSIS — G4733 Obstructive sleep apnea (adult) (pediatric): Secondary | ICD-10-CM | POA: Diagnosis not present

## 2024-03-04 DIAGNOSIS — R7309 Other abnormal glucose: Secondary | ICD-10-CM | POA: Diagnosis not present

## 2024-03-04 DIAGNOSIS — R600 Localized edema: Secondary | ICD-10-CM | POA: Diagnosis not present

## 2024-03-04 MED ORDER — RYBELSUS 7 MG PO TABS: 7.0000 mg | ORAL_TABLET | Freq: Every day | ORAL | 1 refills | Status: DC

## 2024-03-04 MED ORDER — RYBELSUS 3 MG PO TABS
3.0000 mg | ORAL_TABLET | Freq: Every day | ORAL | 0 refills | Status: DC
  Filled 2024-03-04 – 2024-03-06 (×3): qty 30, 30d supply, fill #0

## 2024-03-04 MED ORDER — POTASSIUM CHLORIDE CRYS ER 20 MEQ PO TBCR
20.0000 meq | EXTENDED_RELEASE_TABLET | Freq: Every day | ORAL | 1 refills | Status: AC
  Filled 2024-03-04 – 2024-06-11 (×2): qty 90, 90d supply, fill #0

## 2024-03-05 ENCOUNTER — Other Ambulatory Visit: Payer: Self-pay

## 2024-03-06 ENCOUNTER — Other Ambulatory Visit: Payer: Self-pay

## 2024-03-12 ENCOUNTER — Other Ambulatory Visit: Payer: Self-pay

## 2024-03-26 ENCOUNTER — Other Ambulatory Visit: Payer: Self-pay

## 2024-03-26 DIAGNOSIS — H60331 Swimmer's ear, right ear: Secondary | ICD-10-CM | POA: Diagnosis not present

## 2024-03-26 DIAGNOSIS — H6993 Unspecified Eustachian tube disorder, bilateral: Secondary | ICD-10-CM | POA: Diagnosis not present

## 2024-03-26 DIAGNOSIS — R0981 Nasal congestion: Secondary | ICD-10-CM | POA: Diagnosis not present

## 2024-03-26 MED ORDER — PREDNISONE 5 MG PO TABS
ORAL_TABLET | ORAL | 0 refills | Status: AC
Start: 2024-03-26 — End: ?
  Filled 2024-03-26: qty 21, 6d supply, fill #0

## 2024-03-26 MED ORDER — PEG-3350/ELECTROLYTES 236 G PO SOLR
ORAL | 0 refills | Status: AC
Start: 2024-02-21 — End: ?
  Filled 2024-03-26: qty 4000, 1d supply, fill #0

## 2024-03-26 MED ORDER — CIPROFLOXACIN-DEXAMETHASONE 0.3-0.1 % OT SUSP
4.0000 [drp] | Freq: Two times a day (BID) | OTIC | 0 refills | Status: AC
  Filled 2024-03-26: qty 7.5, 19d supply, fill #0

## 2024-03-26 MED ORDER — BISACODYL 5 MG PO TBEC
DELAYED_RELEASE_TABLET | ORAL | 0 refills | Status: AC
Start: 2024-02-21 — End: ?
  Filled 2024-03-26: qty 4, 1d supply, fill #0

## 2024-03-27 ENCOUNTER — Other Ambulatory Visit: Payer: Self-pay

## 2024-03-27 ENCOUNTER — Other Ambulatory Visit (HOSPITAL_COMMUNITY): Payer: Self-pay

## 2024-04-01 ENCOUNTER — Other Ambulatory Visit: Payer: Self-pay

## 2024-04-01 ENCOUNTER — Other Ambulatory Visit (HOSPITAL_COMMUNITY): Payer: Self-pay

## 2024-04-01 MED ORDER — RINVOQ 30 MG PO TB24
1.0000 | ORAL_TABLET | Freq: Every day | ORAL | 0 refills | Status: DC
  Filled 2024-04-01 – 2024-04-03 (×3): qty 30, 30d supply, fill #0

## 2024-04-03 ENCOUNTER — Other Ambulatory Visit: Payer: Self-pay

## 2024-04-03 ENCOUNTER — Other Ambulatory Visit (HOSPITAL_COMMUNITY): Payer: Self-pay

## 2024-04-03 ENCOUNTER — Other Ambulatory Visit: Payer: Self-pay | Admitting: Pharmacist

## 2024-04-03 MED ORDER — RINVOQ 30 MG PO TB24
1.0000 | ORAL_TABLET | Freq: Every day | ORAL | 0 refills | Status: DC
  Filled 2024-04-03: qty 30, 30d supply, fill #0

## 2024-04-03 NOTE — Progress Notes (Signed)
 Specialty Pharmacy Refill Coordination Note  Spencer Heath is a 58 y.o. male contacted today regarding refills of specialty medication(s) Upadacitinib  (Rinvoq )   Patient requested Delivery   Delivery date: 04/08/24   Verified address: [address] [name]   Medication will be filled on: 04/07/24

## 2024-04-03 NOTE — Progress Notes (Signed)
 Clinical Intervention Note  Clinical Intervention Notes: Patient wishes to inquire about Rinvoq  causing skin cancer. 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 Advised that this is possibly related but he will need to discuss further with dermatologist who performed the procedure. He will reach out to their office.   Clinical Intervention Outcomes: Prevention of an adverse drug event   Spencer Heath Brow Specialty Pharmacist

## 2024-04-08 DIAGNOSIS — Z980 Intestinal bypass and anastomosis status: Secondary | ICD-10-CM | POA: Diagnosis not present

## 2024-04-08 DIAGNOSIS — K5080 Crohn's disease of both small and large intestine without complications: Secondary | ICD-10-CM | POA: Diagnosis not present

## 2024-04-08 DIAGNOSIS — K5289 Other specified noninfective gastroenteritis and colitis: Secondary | ICD-10-CM | POA: Diagnosis not present

## 2024-04-08 DIAGNOSIS — K5730 Diverticulosis of large intestine without perforation or abscess without bleeding: Secondary | ICD-10-CM | POA: Diagnosis not present

## 2024-04-23 ENCOUNTER — Telehealth: Payer: Self-pay | Admitting: Pharmacist

## 2024-04-23 NOTE — Telephone Encounter (Signed)
 Called patient to schedule an appointment for the [facility] Specialty Medication [facility]. I was unable to reach the patient so I left a HIPAA-compliant message requesting that the patient return my call.   Herlene Fleeta Morris, PharmD, JAQUELINE, CPP Clinical Pharmacist [facility] & [facility] [phone]

## 2024-04-25 ENCOUNTER — Other Ambulatory Visit: Payer: Self-pay

## 2024-04-25 DIAGNOSIS — K5090 Crohn's disease, unspecified, without complications: Secondary | ICD-10-CM | POA: Diagnosis not present

## 2024-04-29 ENCOUNTER — Other Ambulatory Visit (HOSPITAL_COMMUNITY): Payer: Self-pay

## 2024-05-01 ENCOUNTER — Ambulatory Visit: Attending: Internal Medicine | Admitting: Pharmacist

## 2024-05-01 ENCOUNTER — Other Ambulatory Visit: Payer: Self-pay

## 2024-05-01 ENCOUNTER — Encounter: Payer: Self-pay | Admitting: Pharmacist

## 2024-05-01 DIAGNOSIS — M545 Low back pain, unspecified: Secondary | ICD-10-CM | POA: Diagnosis not present

## 2024-05-01 DIAGNOSIS — Z7189 Other specified counseling: Secondary | ICD-10-CM

## 2024-05-01 DIAGNOSIS — M25522 Pain in left elbow: Secondary | ICD-10-CM | POA: Diagnosis not present

## 2024-05-01 DIAGNOSIS — M79604 Pain in right leg: Secondary | ICD-10-CM | POA: Diagnosis not present

## 2024-05-01 MED ORDER — METHOCARBAMOL 500 MG PO TABS
500.0000 mg | ORAL_TABLET | Freq: Four times a day (QID) | ORAL | 0 refills | Status: AC
  Filled 2024-05-01: qty 20, 5d supply, fill #0

## 2024-05-01 MED ORDER — RINVOQ 30 MG PO TB24
1.0000 | ORAL_TABLET | Freq: Every day | ORAL | 0 refills | Status: DC
  Filled 2024-05-01 – 2024-05-02 (×2): qty 30, 30d supply, fill #0

## 2024-05-01 MED ORDER — PREDNISONE 10 MG PO TABS
ORAL_TABLET | ORAL | 0 refills | Status: DC
  Filled 2024-05-01: qty 15, 10d supply, fill #0

## 2024-05-01 NOTE — Progress Notes (Signed)
°  S: Patient presents today for review of their specialty medication.   Patient is taking Rinvoq  for Crohn's disease. Patient is managed by Dr. Lucian for this.   Dosing: Adult   Crohn's disease: induction 45 mg once daily x12 weeks followed by 15 mg once daily (PO) for most patients. In some patients, may consider increased mt dose of 30 mg once daily.   Adherence: confirms  Efficacy: reports that he is experiencing an increase in incidence of flares. He plans on discussing potential therapy changes with his MD.   Monitoring: S/sx thromboembolism: none, no medical hx S/sx malignancy: none S/sx of infection:none   Current adverse effects: none    O:     Lab Results  Component Value Date   WBC 9.3 09/03/2019   HGB 12.7 (L) 09/03/2019   HCT 37.8 (L) 09/03/2019   MCV 81.5 09/03/2019   PLT 342 09/03/2019      Chemistry      Component Value Date/Time   NA 137 09/03/2019 1918   NA 142 10/15/2013 1822   K 2.6 (LL) 09/03/2019 1918   K 3.0 (L) 10/15/2013 1822   CL 96 (L) 09/03/2019 1918   CL 105 10/15/2013 1822   CO2 34 (H) 09/03/2019 1918   CO2 32 10/15/2013 1822   BUN 17 09/03/2019 1918   BUN 14 10/15/2013 1822   CREATININE 1.06 09/03/2019 1918   CREATININE 1.17 10/15/2013 1822      Component Value Date/Time   CALCIUM 8.9 09/03/2019 1918   CALCIUM 9.0 10/15/2013 1822   ALKPHOS 124 09/03/2019 1918   ALKPHOS 109 10/15/2013 1822   AST 18 09/03/2019 1918   AST 14 (L) 10/15/2013 1822   ALT 23 09/03/2019 1918   ALT 21 10/15/2013 1822   BILITOT 0.9 09/03/2019 1918   BILITOT 0.6 10/15/2013 1822       No results found for: CHOL, HDL, LDLCALC, LDLDIRECT, TRIG, CHOLHDL   A/P: 1. Medication review: patient currently taking Rinvoq  for Crohn's disease. Reviewed the medication with the patient, including the following: Rinvoq  is a medication used to treat Crohn's. Administer with or without food. Swallow tablet whole; do not crush, split, or chew. Possible  adverse effects include increased risk of infection, GI upset, hematologic toxicity, hepatic effects, lipid abnormalities, increased risk of malignancy, thromboembolism. Avoid live vaccinations. No recommendations for any changes.  Herlene Fleeta Morris, PharmD, JAQUELINE, CPP Clinical Pharmacist [facility] & [facility] [phone]

## 2024-05-02 ENCOUNTER — Other Ambulatory Visit: Payer: Self-pay

## 2024-05-02 NOTE — Progress Notes (Unsigned)
 Opened in error

## 2024-05-05 ENCOUNTER — Other Ambulatory Visit: Payer: Self-pay

## 2024-05-05 NOTE — Progress Notes (Signed)
 Specialty Pharmacy Refill Coordination Note  Spencer Heath is a 58 y.o. male contacted today regarding refills of specialty medication(s) Upadacitinib  (Rinvoq )   Patient requested Delivery   Delivery date: 05/07/24   Verified address: [address] [place]   Medication will be filled on: 05/06/24

## 2024-05-08 ENCOUNTER — Other Ambulatory Visit: Payer: Self-pay

## 2024-05-09 ENCOUNTER — Other Ambulatory Visit: Payer: Self-pay

## 2024-05-09 MED ORDER — HYDRALAZINE HCL 25 MG PO TABS
25.0000 mg | ORAL_TABLET | Freq: Two times a day (BID) | ORAL | 1 refills | Status: AC
  Filled 2024-05-09 – 2024-06-11 (×2): qty 180, 90d supply, fill #0

## 2024-05-13 ENCOUNTER — Other Ambulatory Visit: Payer: Self-pay

## 2024-05-29 ENCOUNTER — Other Ambulatory Visit (HOSPITAL_COMMUNITY): Payer: Self-pay

## 2024-05-29 ENCOUNTER — Other Ambulatory Visit: Payer: Self-pay

## 2024-05-30 ENCOUNTER — Other Ambulatory Visit: Payer: Self-pay

## 2024-06-02 ENCOUNTER — Other Ambulatory Visit: Payer: Self-pay

## 2024-06-03 ENCOUNTER — Other Ambulatory Visit: Payer: Self-pay

## 2024-06-06 ENCOUNTER — Other Ambulatory Visit: Payer: Self-pay

## 2024-06-06 MED ORDER — DESONIDE 0.05 % EX OINT
1.0000 | TOPICAL_OINTMENT | Freq: Two times a day (BID) | CUTANEOUS | 0 refills | Status: AC
  Filled 2024-06-06: qty 15, 30d supply, fill #0

## 2024-06-06 MED ORDER — TRIAMCINOLONE ACETONIDE 0.1 % EX CREA
1.0000 | TOPICAL_CREAM | Freq: Two times a day (BID) | CUTANEOUS | 0 refills | Status: AC
  Filled 2024-06-06: qty 30, 15d supply, fill #0

## 2024-06-09 ENCOUNTER — Other Ambulatory Visit: Payer: Self-pay

## 2024-06-09 MED ORDER — RINVOQ 30 MG PO TB24: 1.0000 | ORAL_TABLET | Freq: Every day | ORAL | 0 refills | Status: AC

## 2024-06-10 ENCOUNTER — Other Ambulatory Visit (HOSPITAL_COMMUNITY): Payer: Self-pay

## 2024-06-11 ENCOUNTER — Other Ambulatory Visit: Payer: Self-pay

## 2024-06-11 MED ORDER — VITAMIN D (ERGOCALCIFEROL) 1.25 MG (50000 UNIT) PO CAPS
50000.0000 [IU] | ORAL_CAPSULE | ORAL | 1 refills | Status: AC
  Filled 2024-06-11 (×2): qty 12, 84d supply, fill #0
